# Patient Record
Sex: Female | Born: 1960 | Race: Asian | Hispanic: No | Marital: Married | Smoking: Never smoker
Health system: Southern US, Community
[De-identification: ages and names within clinical notes are randomized; demographics above are authoritative.]

## PROBLEM LIST (undated history)

## (undated) DIAGNOSIS — B009 Herpesviral infection, unspecified: Secondary | ICD-10-CM

## (undated) DIAGNOSIS — R202 Paresthesia of skin: Principal | ICD-10-CM

## (undated) DIAGNOSIS — E559 Vitamin D deficiency, unspecified: Secondary | ICD-10-CM

## (undated) DIAGNOSIS — D563 Thalassemia minor: Secondary | ICD-10-CM

## (undated) HISTORY — DX: Vitamin D deficiency, unspecified: E55.9

## (undated) HISTORY — PX: TONSILLECTOMY: SUR1361

## (undated) HISTORY — PX: HEMORRHOID SURGERY: SHX153

## (undated) HISTORY — DX: Paresthesia of skin: R20.2

## (undated) HISTORY — DX: Herpesviral infection, unspecified: B00.9

## (undated) HISTORY — DX: Thalassemia minor: D56.3

---

## 1997-09-24 ENCOUNTER — Other Ambulatory Visit: Admission: RE | Admit: 1997-09-24 | Discharge: 1997-09-24 | Payer: Self-pay | Admitting: Obstetrics and Gynecology

## 1998-10-07 ENCOUNTER — Other Ambulatory Visit: Admission: RE | Admit: 1998-10-07 | Discharge: 1998-10-07 | Payer: Self-pay | Admitting: Gynecology

## 1999-08-24 ENCOUNTER — Other Ambulatory Visit: Admission: RE | Admit: 1999-08-24 | Discharge: 1999-08-24 | Payer: Self-pay | Admitting: Gynecology

## 1999-08-26 ENCOUNTER — Ambulatory Visit (HOSPITAL_COMMUNITY): Admission: RE | Admit: 1999-08-26 | Discharge: 1999-08-26 | Payer: Self-pay | Admitting: Gynecology

## 1999-08-26 ENCOUNTER — Encounter: Payer: Self-pay | Admitting: Gynecology

## 2001-06-28 ENCOUNTER — Other Ambulatory Visit: Admission: RE | Admit: 2001-06-28 | Discharge: 2001-06-28 | Payer: Self-pay | Admitting: Gynecology

## 2001-07-17 ENCOUNTER — Encounter: Payer: Self-pay | Admitting: Gynecology

## 2001-07-17 ENCOUNTER — Ambulatory Visit (HOSPITAL_COMMUNITY): Admission: RE | Admit: 2001-07-17 | Discharge: 2001-07-17 | Payer: Self-pay | Admitting: Gynecology

## 2001-10-31 ENCOUNTER — Encounter: Admission: RE | Admit: 2001-10-31 | Discharge: 2001-12-05 | Payer: Self-pay | Admitting: Orthopedic Surgery

## 2002-07-31 ENCOUNTER — Ambulatory Visit (HOSPITAL_COMMUNITY): Admission: RE | Admit: 2002-07-31 | Discharge: 2002-07-31 | Payer: Self-pay | Admitting: Gynecology

## 2002-07-31 ENCOUNTER — Encounter: Payer: Self-pay | Admitting: Gynecology

## 2002-12-13 ENCOUNTER — Other Ambulatory Visit: Admission: RE | Admit: 2002-12-13 | Discharge: 2002-12-13 | Payer: Self-pay | Admitting: Gynecology

## 2003-12-08 ENCOUNTER — Ambulatory Visit (HOSPITAL_COMMUNITY): Admission: RE | Admit: 2003-12-08 | Discharge: 2003-12-08 | Payer: Self-pay | Admitting: Gynecology

## 2004-01-08 ENCOUNTER — Other Ambulatory Visit: Admission: RE | Admit: 2004-01-08 | Discharge: 2004-01-08 | Payer: Self-pay | Admitting: Gynecology

## 2006-02-06 ENCOUNTER — Other Ambulatory Visit: Admission: RE | Admit: 2006-02-06 | Discharge: 2006-02-06 | Payer: Self-pay | Admitting: Gynecology

## 2006-08-24 ENCOUNTER — Ambulatory Visit (HOSPITAL_COMMUNITY): Admission: RE | Admit: 2006-08-24 | Discharge: 2006-08-24 | Payer: Self-pay | Admitting: Gynecology

## 2007-06-22 ENCOUNTER — Other Ambulatory Visit: Admission: RE | Admit: 2007-06-22 | Discharge: 2007-06-22 | Payer: Self-pay | Admitting: Gynecology

## 2007-08-29 ENCOUNTER — Ambulatory Visit (HOSPITAL_COMMUNITY): Admission: RE | Admit: 2007-08-29 | Discharge: 2007-08-29 | Payer: Self-pay | Admitting: Gynecology

## 2007-09-04 ENCOUNTER — Encounter: Admission: RE | Admit: 2007-09-04 | Discharge: 2007-09-04 | Payer: Self-pay | Admitting: Gynecology

## 2008-03-10 ENCOUNTER — Ambulatory Visit: Payer: Self-pay | Admitting: Gynecology

## 2008-03-17 ENCOUNTER — Ambulatory Visit: Payer: Self-pay | Admitting: Gynecology

## 2008-05-09 HISTORY — PX: OTHER SURGICAL HISTORY: SHX169

## 2008-05-12 ENCOUNTER — Ambulatory Visit: Payer: Self-pay | Admitting: Gynecology

## 2008-05-14 ENCOUNTER — Ambulatory Visit: Payer: Self-pay | Admitting: Gynecology

## 2008-05-15 ENCOUNTER — Ambulatory Visit (HOSPITAL_BASED_OUTPATIENT_CLINIC_OR_DEPARTMENT_OTHER): Admission: RE | Admit: 2008-05-15 | Discharge: 2008-05-15 | Payer: Self-pay | Admitting: Gynecology

## 2008-05-15 ENCOUNTER — Encounter: Payer: Self-pay | Admitting: Gynecology

## 2008-05-15 ENCOUNTER — Ambulatory Visit: Payer: Self-pay | Admitting: Gynecology

## 2008-05-28 ENCOUNTER — Ambulatory Visit: Payer: Self-pay | Admitting: Gynecology

## 2008-06-23 ENCOUNTER — Other Ambulatory Visit: Admission: RE | Admit: 2008-06-23 | Discharge: 2008-06-23 | Payer: Self-pay | Admitting: Gynecology

## 2008-06-23 ENCOUNTER — Encounter: Payer: Self-pay | Admitting: Gynecology

## 2008-06-23 ENCOUNTER — Ambulatory Visit: Payer: Self-pay | Admitting: Gynecology

## 2008-09-17 ENCOUNTER — Ambulatory Visit (HOSPITAL_COMMUNITY): Admission: RE | Admit: 2008-09-17 | Discharge: 2008-09-17 | Payer: Self-pay | Admitting: Gynecology

## 2009-09-18 ENCOUNTER — Other Ambulatory Visit: Admission: RE | Admit: 2009-09-18 | Discharge: 2009-09-18 | Payer: Self-pay | Admitting: Gynecology

## 2009-09-18 ENCOUNTER — Ambulatory Visit (HOSPITAL_COMMUNITY): Admission: RE | Admit: 2009-09-18 | Discharge: 2009-09-18 | Payer: Self-pay | Admitting: Gynecology

## 2009-09-18 ENCOUNTER — Ambulatory Visit: Payer: Self-pay | Admitting: Gynecology

## 2010-05-30 ENCOUNTER — Encounter: Payer: Self-pay | Admitting: Gynecology

## 2010-09-21 NOTE — H&P (Signed)
[name], [name]                    ACCOUNT NO.:  [id number]   MEDICAL RECORD NO.:  [id number]          PATIENT TYPE:  AMB   LOCATION:  [facility]                         FACILITY:  [facility]   PHYSICIAN:  Juan H. Lily Peer, M.D.DATE OF BIRTH:  1960/12/02   DATE OF ADMISSION:  DATE OF DISCHARGE:                              HISTORY & PHYSICAL   The patient is scheduled for surgery on Thursday, January 7th at 7:30  a.m. at [facility].  Please have physical available.   CHIEF COMPLAINT:  1. Dysfunctional uterine bleeding.  2. Endometrial polyp.   HISTORY OF PRESENT ILLNESS:  The patient is a 50 year old gravida 2,  para 2 who was seen in the office for sonohysterogram, discussion of  previous endometrial biopsy, and blood work.  Since, the patient had had  a history of dysfunctional uterine bleeding and bleeding also  intermenstrual.  She had been placed on Megace 20 mg for 5 days to stop  her bleeding.  Her TSH, prolactin, and FSH were all normal.  She had  been using condoms for contraception.  UPT had been negative.  Endometrial biopsy demonstrated a benign proliferative endometrium.  No  evidence of hyperplasia or malignancy.  The sonohysterogram demonstrated  a small intramural myoma measuring 18 mm x 17 mm, echogenic focus in the  endometrial cavity was noted measuring 7 mm x 6 mm, right dominant  ovarian follicle measuring 21 mm x 19 mm, and the left ovary had a  follicle measuring 26 mm x 23 mm.  The sonohysterogram demonstrated an  echogenic defect measuring 8 mm x 5 mm and synechia 6 mm on the anterior  uterine wall.  The patient is scheduled to undergo diagnostic  hysteroscopy and resectoscope polypectomy.   PAST MEDICAL HISTORY:  She has had one previous tubal sterilization  procedure and one normal spontaneous vaginal delivery.  She has had  history in the past of HSV2.  The patient also has beta-thalassemia  minor trait.   ALLERGIES:  The patient denies any  allergies.   FAMILY HISTORY:  Significant for father for diabetes.   PHYSICAL EXAMINATION:  VITAL SIGNS:  The patient weighs 135 pounds, she  is 5 feet 1/4 inches tall, and blood pressure is 120/80.  HEENT:  Unremarkable.  NECK:  Supple.  Trachea midline.  No carotid bruits.  No thyromegaly.  LUNGS:  Clear to auscultation without rhonchi or wheezes.  HEART:  Regular rate and rhythm.  No murmurs or gallop.  BREAST:  Not done.  ABDOMEN:  Soft and nontender.  No rebound.  No guarding.  PELVIC:  Bartholin, urethra, and Skene's are within normal limits.  VAGINA AND CERVIX:  No gross lesions on inspection.  UTERUS:  Slightly retroverted.  ADNEXA:  No palpable mass or tenderness.  RECTAL:  Deferred.   ASSESSMENT:  A 50 year old gravida 2, para 2 female with dysfunctional  uterine bleeding.  Sonohysterogram demonstrated endometrial polyp.  Also, the patient with history of beta-thalassemia minor trait.  Endometrial biopsy was benign.  The patient is scheduled to undergo  resectoscope polypectomy and  diagnostic hysteroscopy.  The risks,  benefits, and pros and cons of procedure were discussed.  All questions  were answered and we will follow accordingly.   PLAN:  The patient is scheduled for diagnostic hysteroscopy and  resectoscope polypectomy on Thursday, January 7th at [facility].  Please have history and physical available.      Juan H. Lily Peer, M.D.  Electronically Signed     JHF/MEDQ  D:  05/14/2008  T:  05/14/2008  Job:  [id number]

## 2010-09-21 NOTE — Op Note (Signed)
[name], [name]                    ACCOUNT NO.:  [id number]   MEDICAL RECORD NO.:  [id number]          PATIENT TYPE:  AMB   LOCATION:  [facility]                         FACILITY:  [facility]   PHYSICIAN:  Juan H. Lily Peer, M.D.DATE OF BIRTH:  1961/03/08   DATE OF PROCEDURE:  DATE OF DISCHARGE:                               OPERATIVE REPORT   SURGEON:  Juan H. Lily Peer, MD   INDICATIONS FOR OPERATION:  A 50 year old gravida 2, para 2 who is being  taken to the operating room as a result of her dysfunctional uterine  bleeding.  Preoperative evaluation had demonstrated endometrial polyps.  Endometrial biopsy had been benign.   PREOPERATIVE DIAGNOSES:  1. Dysfunctional uterine bleeding.  2. Endometrial polyps.   POSTOPERATIVE DIAGNOSIS:  1. Dysfunctional uterine bleeding.  2. Endometrial polyps.   ANESTHESIA:  General endotracheal anesthesia.   PROCEDURE PERFORMED:  1. Diagnostic hysteroscopy.  2. Resectoscopic polypectomy.   FINDINGS:  The patient had two small lower uterine segment endometrial  polyps; tubal ostia, minimally seen; and the cervical canal was clear.   DESCRIPTION OF OPERATION:  After the patient was adequately counseled,  she was taken to the operating room where she underwent a successful  general endotracheal anesthesia.  She had received a gram of cefotetan  for prophylaxis, had PSA stockings for DVT prophylaxis.  The laminaria  that had been placed intracervical the day before was removed.  The  vagina and perineum were prepped and draped in usual sterile fashion.  A  red rubber Roxan Hockey had been inserted to evacuate the bladder of its  contents for approximately 50 mL.  A short-billed speculum was placed in  the posterior vaginal vault.  A Sims retractor in the anterior vaginal  vault.  The anterior cervical lip was grasped with a single-tooth  tenaculum.  The cervix required no dilatation as a result of the  Cervidil.  The uterus sounded to approximately 8 cm  and the uterus was  retroverted.  The Lendell Caprice operative resectoscope with a 90 degrees  wire loop was introduced into the intrauterine cavity with 3% sorbitol  with the distending media.  The [facility] surgical generator  was set at 80 coagulation mode and 80 on the cutting mode.  A systematic  inspection demonstrated both endometrial polyps in the lower uterine  segment anteriorly, were resected off its base, and submitted for  histological evaluation.  There was a lot of bleeding and oozing.  A  vigorous curettage was performed afterwards and a separate specimen was  submitted to contain the bleeding.  The VaporTrode was utilized for  hemostasis.  The patient tolerated the procedure well.  The patient's  preprocedure and postprocedure were taken.   ESTIMATED BLOOD LOSS:  Minimal.   FLUID RESUSCITATION:  600 mL of lactated Ringer's.   FLUID DEFICIT:  3% sorbitol was 100 mL.      Juan H. Lily Peer, M.D.  Electronically Signed     JHF/MEDQ  D:  05/15/2008  T:  05/15/2008  Job:  [id number]

## 2010-10-08 ENCOUNTER — Other Ambulatory Visit: Payer: Self-pay | Admitting: Gynecology

## 2010-10-08 DIAGNOSIS — Z1231 Encounter for screening mammogram for malignant neoplasm of breast: Secondary | ICD-10-CM

## 2010-10-14 ENCOUNTER — Ambulatory Visit (HOSPITAL_COMMUNITY)
Admission: RE | Admit: 2010-10-14 | Discharge: 2010-10-14 | Disposition: A | Discharge status: Home / Self Care | Payer: BC Managed Care – PPO | Source: Ambulatory Visit | Attending: Gynecology | Admitting: Gynecology

## 2010-10-14 DIAGNOSIS — Z1231 Encounter for screening mammogram for malignant neoplasm of breast: Secondary | ICD-10-CM | POA: Insufficient documentation

## 2010-10-27 ENCOUNTER — Encounter (INDEPENDENT_AMBULATORY_CARE_PROVIDER_SITE_OTHER): Payer: BC Managed Care – PPO | Admitting: Gynecology

## 2010-10-27 ENCOUNTER — Other Ambulatory Visit: Payer: Self-pay | Admitting: Gynecology

## 2010-10-27 ENCOUNTER — Other Ambulatory Visit (HOSPITAL_COMMUNITY)
Admission: RE | Admit: 2010-10-27 | Discharge: 2010-10-27 | Disposition: A | Discharge status: Home / Self Care | Payer: BC Managed Care – PPO | Source: Ambulatory Visit | Attending: Gynecology | Admitting: Gynecology

## 2010-10-27 DIAGNOSIS — N831 Corpus luteum cyst of ovary, unspecified side: Secondary | ICD-10-CM

## 2010-10-27 DIAGNOSIS — D259 Leiomyoma of uterus, unspecified: Secondary | ICD-10-CM

## 2010-10-27 DIAGNOSIS — N942 Vaginismus: Secondary | ICD-10-CM

## 2010-10-27 DIAGNOSIS — Z01419 Encounter for gynecological examination (general) (routine) without abnormal findings: Secondary | ICD-10-CM

## 2010-10-27 DIAGNOSIS — Z124 Encounter for screening for malignant neoplasm of cervix: Secondary | ICD-10-CM | POA: Insufficient documentation

## 2011-04-08 ENCOUNTER — Ambulatory Visit
Admission: RE | Admit: 2011-04-08 | Discharge: 2011-04-08 | Disposition: A | Discharge status: Home / Self Care | Payer: BC Managed Care – PPO | Source: Ambulatory Visit | Attending: Internal Medicine | Admitting: Internal Medicine

## 2011-04-08 ENCOUNTER — Other Ambulatory Visit: Payer: Self-pay | Admitting: Internal Medicine

## 2011-04-08 DIAGNOSIS — R52 Pain, unspecified: Secondary | ICD-10-CM

## 2011-05-10 HISTORY — PX: COLONOSCOPY W/ POLYPECTOMY: SHX1380

## 2011-10-19 ENCOUNTER — Other Ambulatory Visit: Payer: Self-pay | Admitting: Gynecology

## 2011-10-19 DIAGNOSIS — Z1231 Encounter for screening mammogram for malignant neoplasm of breast: Secondary | ICD-10-CM

## 2011-11-16 ENCOUNTER — Ambulatory Visit (INDEPENDENT_AMBULATORY_CARE_PROVIDER_SITE_OTHER): Payer: BC Managed Care – PPO | Admitting: Gynecology

## 2011-11-16 ENCOUNTER — Encounter: Payer: Self-pay | Admitting: Gynecology

## 2011-11-16 ENCOUNTER — Ambulatory Visit (HOSPITAL_COMMUNITY)
Admission: RE | Admit: 2011-11-16 | Discharge: 2011-11-16 | Disposition: A | Discharge status: Home / Self Care | Payer: BC Managed Care – PPO | Source: Ambulatory Visit | Attending: Gynecology | Admitting: Gynecology

## 2011-11-16 VITALS — BP 120/80 | Ht 62.25 in | Wt 127.0 lb

## 2011-11-16 DIAGNOSIS — Z1231 Encounter for screening mammogram for malignant neoplasm of breast: Secondary | ICD-10-CM

## 2011-11-16 DIAGNOSIS — Z780 Asymptomatic menopausal state: Secondary | ICD-10-CM | POA: Insufficient documentation

## 2011-11-16 DIAGNOSIS — Z01419 Encounter for gynecological examination (general) (routine) without abnormal findings: Secondary | ICD-10-CM

## 2011-11-16 NOTE — Patient Instructions (Addendum)
You need to take a calcium with vitamin D twice a day. Several options are: Caltrate Plus or Oscal or Citracal or Viactiv.  Daily requirement should be: Calciium 1200-1500 mg/day and vitamin D 2,000 units per day.  Health Maintenance, Females A healthy lifestyle and preventative care can promote health and wellness.  Maintain regular health, dental, and eye exams.   Eat a healthy diet. Foods like vegetables, fruits, whole grains, low-fat dairy products, and lean protein foods contain the nutrients you need without too many calories. Decrease your intake of foods high in solid fats, added sugars, and salt. Get information about a proper diet from your caregiver, if necessary.   Regular physical exercise is one of the most important things you can do for your health. Most adults should get at least 150 minutes of moderate-intensity exercise (any activity that increases your heart rate and causes you to sweat) each week. In addition, most adults need muscle-strengthening exercises on 2 or more days a week.    Maintain a healthy weight. The body mass index (BMI) is a screening tool to identify possible weight problems. It provides an estimate of body fat based on height and weight. Your caregiver can help determine your BMI, and can help you achieve or maintain a healthy weight. For adults 20 years and older:   A BMI below 18.5 is considered underweight.   A BMI of 18.5 to 24.9 is normal.   A BMI of 25 to 29.9 is considered overweight.   A BMI of 30 and above is considered obese.   Maintain normal blood lipids and cholesterol by exercising and minimizing your intake of saturated fat. Eat a balanced diet with plenty of fruits and vegetables. Blood tests for lipids and cholesterol should begin at age 65 and be repeated every 5 years. If your lipid or cholesterol levels are high, you are over 50, or you are a high risk for heart disease, you may need your cholesterol levels checked more  frequently.Ongoing high lipid and cholesterol levels should be treated with medicines if diet and exercise are not effective.   If you smoke, find out from your caregiver how to quit. If you do not use tobacco, do not start.   If you are pregnant, do not drink alcohol. If you are breastfeeding, be very cautious about drinking alcohol. If you are not pregnant and choose to drink alcohol, do not exceed 1 drink per day. One drink is considered to be 12 ounces (355 mL) of beer, 5 ounces (148 mL) of wine, or 1.5 ounces (44 mL) of liquor.   Avoid use of street drugs. Do not share needles with anyone. Ask for help if you need support or instructions about stopping the use of drugs.   High blood pressure causes heart disease and increases the risk of stroke. Blood pressure should be checked at least every 1 to 2 years. Ongoing high blood pressure should be treated with medicines, if weight loss and exercise are not effective.   If you are 14 to 51 years old, ask your caregiver if you should take aspirin to prevent strokes.   Diabetes screening involves taking a blood sample to check your fasting blood sugar level. This should be done once every 3 years, after age 36, if you are within normal weight and without risk factors for diabetes. Testing should be considered at a younger age or be carried out more frequently if you are overweight and have at least 1 risk factor for  diabetes.   Breast cancer screening is essential preventative care for women. You should practice "breast self-awareness." This means understanding the normal appearance and feel of your breasts and may include breast self-examination. Any changes detected, no matter how small, should be reported to a caregiver. Women in their 24s and 30s should have a clinical breast exam (CBE) by a caregiver as part of a regular health exam every 1 to 3 years. After age 83, women should have a CBE every year. Starting at age 57, women should consider  having a mammogram (breast X-ray) every year. Women who have a family history of breast cancer should talk to their caregiver about genetic screening. Women at a high risk of breast cancer should talk to their caregiver about having an MRI and a mammogram every year.   The Pap test is a screening test for cervical cancer. Women should have a Pap test starting at age 37. Between ages 12 and 27, Pap tests should be repeated every 2 years. Beginning at age 45, you should have a Pap test every 3 years as long as the past 3 Pap tests have been normal. If you had a hysterectomy for a problem that was not cancer or a condition that could lead to cancer, then you no longer need Pap tests. If you are between ages 49 and 18, and you have had normal Pap tests going back 10 years, you no longer need Pap tests. If you have had past treatment for cervical cancer or a condition that could lead to cancer, you need Pap tests and screening for cancer for at least 20 years after your treatment. If Pap tests have been discontinued, risk factors (such as a new sexual partner) need to be reassessed to determine if screening should be resumed. Some women have medical problems that increase the chance of getting cervical cancer. In these cases, your caregiver may recommend more frequent screening and Pap tests.   The human papillomavirus (HPV) test is an additional test that may be used for cervical cancer screening. The HPV test looks for the virus that can cause the cell changes on the cervix. The cells collected during the Pap test can be tested for HPV. The HPV test could be used to screen women aged 65 years and older, and should be used in women of any age who have unclear Pap test results. After the age of 77, women should have HPV testing at the same frequency as a Pap test.   Colorectal cancer can be detected and often prevented. Most routine colorectal cancer screening begins at the age of 35 and continues through age 38.  However, your caregiver may recommend screening at an earlier age if you have risk factors for colon cancer. On a yearly basis, your caregiver may provide home test kits to check for hidden blood in the stool. Use of a small camera at the end of a tube, to directly examine the colon (sigmoidoscopy or colonoscopy), can detect the earliest forms of colorectal cancer. Talk to your caregiver about this at age 22, when routine screening begins. Direct examination of the colon should be repeated every 5 to 10 years through age 86, unless early forms of pre-cancerous polyps or small growths are found.   Hepatitis C blood testing is recommended for all people born from [birth year] through 1965 and any individual with known risks for hepatitis C.   Practice safe sex. Use condoms and avoid high-risk sexual practices to reduce the spread of  sexually transmitted infections (STIs). Sexually active women aged 8 and younger should be checked for Chlamydia, which is a common sexually transmitted infection. Older women with new or multiple partners should also be tested for Chlamydia. Testing for other STIs is recommended if you are sexually active and at increased risk.   Osteoporosis is a disease in which the bones lose minerals and strength with aging. This can result in serious bone fractures. The risk of osteoporosis can be identified using a bone density scan. Women ages 39 and over and women at risk for fractures or osteoporosis should discuss screening with their caregivers. Ask your caregiver whether you should be taking a calcium supplement or vitamin D to reduce the rate of osteoporosis.   Menopause can be associated with physical symptoms and risks. Hormone replacement therapy is available to decrease symptoms and risks. You should talk to your caregiver about whether hormone replacement therapy is right for you.   Use sunscreen with a sun protection factor (SPF) of 30 or greater. Apply sunscreen liberally and  repeatedly throughout the day. You should seek shade when your shadow is shorter than you. Protect yourself by wearing long sleeves, pants, a wide-brimmed hat, and sunglasses year round, whenever you are outdoors.   Notify your caregiver of new moles or changes in moles, especially if there is a change in shape or color. Also notify your caregiver if a mole is larger than the size of a pencil eraser.   Stay current with your immunizations.  Document Released: 11/08/2010 Document Revised: 04/14/2011 Document Reviewed: 11/08/2010 [facility] Patient Information 2012 [place], [place].  Menopause Menopause is the normal time of life when menstrual periods stop completely. Menopause is complete when you have missed 12 consecutive menstrual periods. It usually occurs between the ages of 56 to 62, with an average age of 26. Very rarely does a woman develop menopause before 51 years old. At menopause, your ovaries stop producing the female hormones, estrogen and progesterone. This can cause undesirable symptoms and also affect your health. Sometimes the symptoms may occur 4 to 5 years before the menopause begins. There is no relationship between menopause and:  Oral contraceptives.   Number of children you had.   Race.   The age your menstrual periods started (menarche).  Heavy smokers and very thin women may develop menopause earlier in life. CAUSES  The ovaries stop producing the female hormones estrogen and progesterone.   Other causes include:   Surgery to remove both ovaries.   The ovaries stop functioning for no known reason.   Tumors of the pituitary gland in the brain.   Medical disease that affects the ovaries and hormone production.   Radiation treatment to the abdomen or pelvis.   Chemotherapy that affects the ovaries.  SYMPTOMS   Hot flashes.   Night sweats.   Decrease in sex drive.   Vaginal dryness and thinning of the vagina causing painful intercourse.   Dryness of  the skin and developing wrinkles.   Headaches.   Tiredness.   Irritability.   Memory problems.   Weight gain.   Bladder infections.   Hair growth of the face and chest.   Infertility.  More serious symptoms include:  Loss of bone (osteoporosis) causing breaks (fractures).   Depression.   Hardening and narrowing of the arteries (atherosclerosis) causing heart attacks and strokes.  DIAGNOSIS   When the menstrual periods have stopped for 12 straight months.   Physical exam.   Hormone studies of the blood.  TREATMENT  There are many treatment choices and nearly as many questions about them. The decisions to treat or not to treat menopausal changes is an individual choice made with your caregiver. Your caregiver can discuss the treatments with you. Together, you can decide which treatment will work best for you. Your treatment choices may include:   Hormone therapy (estorgen and progesterone).   Non-hormonal medications.   Treating the individual symptoms with medication (for example antidepressants for depression).   Herbal medications that may help specific symptoms.   Counseling by a psychiatrist or psychologist.   Group therapy.   Lifestyle changes including:   Eating healthy.   Regular exercise.   Limiting caffeine and alcohol.   Stress management and meditation.   No treatment.  HOME CARE INSTRUCTIONS   Take the medication your caregiver gives you as directed.   Get plenty of sleep and rest.   Exercise regularly.   Eat a diet that contains calcium (good for the bones) and soy products (acts like estrogen hormone).   Avoid alcoholic beverages.   Do not smoke.   If you have hot flashes, dress in layers.   Take supplements, calcium and vitamin D to strengthen bones.   You can use over-the-counter lubricants or moisturizers for vaginal dryness.   Group therapy is sometimes very helpful.   Acupuncture may be helpful in some cases.  SEEK  MEDICAL CARE IF:   You are not sure you are in menopause.   You are having menopausal symptoms and need advice and treatment.   You are still having menstrual periods after age 9.   You have pain with intercourse.   Menopause is complete (no menstrual period for 12 months) and you develop vaginal bleeding.   You need a referral to a specialist (gynecologist, psychiatrist or psychologist) for treatment.  SEEK IMMEDIATE MEDICAL CARE IF:   You have severe depression.   You have excessive vaginal bleeding.   You fell and think you have a broken bone.   You have pain when you urinate.   You develop leg or chest pain.   You have a fast pounding heart beat (palpitations).   You have severe headaches.   You develop vision problems.   You feel a lump in your breast.   You have abdominal pain or severe indigestion.  Document Released: 07/16/2003 Document Revised: 04/14/2011 Document Reviewed: 02/21/2008 [facility] Patient Information 2012 [place], [place].

## 2011-11-16 NOTE — Progress Notes (Signed)
[name] [birth year] [id number]   History:    51 y.o.  for annual gyn exam with no complaints today. Review of patient's history indicates that she has a history of beta thalassemia. Her primary physician has recently done her labs. Her mammogram was done today. She in frequently does her self breast examination. Patient with minimal vasomotor symptoms. His been close to one year since her last menstrual period. Her FSH last year was 16. Her early part of this year patient had a colonoscopy and she stated that they removed 6 polyps. No prior bone density study.  Past medical history,surgical history, family history and social history were all reviewed and documented in the EPIC chart.  Gynecologic History No LMP recorded. Patient is not currently having periods (Reason: Perimenopausal). Contraception: none Last Pap: 2012. Results were: normal Last mammogram: 2013. Results were: normal  Obstetric History OB History    Grav Para Term Preterm Abortions TAB SAB Ect Mult Living   2 2 2       2      # Outc Date GA Lbr Len/2nd Wgt Sex Del Anes PTL Lv   1 TRM     M SVD  No Yes   2 TRM     M CS  No Yes       ROS: A ROS was performed and pertinent positives and negatives are included in the history.  GENERAL: No fevers or chills. HEENT: No change in vision, no earache, sore throat or sinus congestion. NECK: No pain or stiffness. CARDIOVASCULAR: No chest pain or pressure. No palpitations. PULMONARY: No shortness of breath, cough or wheeze. GASTROINTESTINAL: No abdominal pain, nausea, vomiting or diarrhea, melena or bright red blood per rectum. GENITOURINARY: No urinary frequency, urgency, hesitancy or dysuria. MUSCULOSKELETAL: No joint or muscle pain, no back pain, no recent trauma. DERMATOLOGIC: No rash, no itching, no lesions. ENDOCRINE: No polyuria, polydipsia, no heat or cold intolerance. No recent change in weight. HEMATOLOGICAL: No anemia or easy bruising or bleeding. NEUROLOGIC: No headache,  seizures, numbness, tingling or weakness. PSYCHIATRIC: No depression, no loss of interest in normal activity or change in sleep pattern.     Exam: chaperone present  BP 120/80  Ht 5' 2.25" (1.581 m)  Wt 127 lb (57.607 kg)  BMI 23.04 kg/m2  Body mass index is 23.04 kg/(m^2).  General appearance : Well developed well nourished female. No acute distress HEENT: Neck supple, trachea midline, no carotid bruits, no thyroidmegaly Lungs: Clear to auscultation, no rhonchi or wheezes, or rib retractions  Heart: Regular rate and rhythm, no murmurs or gallops Breast:Examined in sitting and supine position were symmetrical in appearance, no palpable masses or tenderness,  no skin retraction, no nipple inversion, no nipple discharge, no skin discoloration, no axillary or supraclavicular lymphadenopathy Abdomen: no palpable masses or tenderness, no rebound or guarding Extremities: no edema or skin discoloration or tenderness  Pelvic:  Bartholin, Urethra, Skene Glands: Within normal limits             Vagina: No gross lesions or discharge  Cervix: No gross lesions or discharge  Uterus  anteverted, normal size, shape and consistency, non-tender and mobile  Adnexa  Without masses or tenderness  Anus and perineum  normal   Rectovaginal  normal sphincter tone without palpated masses or tenderness             Hemoccult not done     Assessment/Plan:  51 y.o. female for annual exam with perimenopausal symptoms. And normal FSH  last year. Patient with minimal symptoms today. An FSH will be drawn today. She will be scheduled for a baseline bone density study in the next few weeks here in the office. Literature information on the menopause and hormone replacement therapy was provided. We discussed importance of calcium and vitamin D along with regular weightbearing exercises for osteoporosis prevention.    Ok Edwards MD, 5:13 PM 11/16/2011

## 2011-11-17 LAB — FOLLICLE STIMULATING HORMONE: FSH: 56.5 m[IU]/mL

## 2011-12-27 ENCOUNTER — Ambulatory Visit (INDEPENDENT_AMBULATORY_CARE_PROVIDER_SITE_OTHER): Payer: BC Managed Care – PPO

## 2011-12-27 DIAGNOSIS — M8580 Other specified disorders of bone density and structure, unspecified site: Secondary | ICD-10-CM

## 2011-12-27 DIAGNOSIS — M949 Disorder of cartilage, unspecified: Secondary | ICD-10-CM

## 2011-12-27 DIAGNOSIS — Z780 Asymptomatic menopausal state: Secondary | ICD-10-CM

## 2011-12-29 ENCOUNTER — Encounter: Payer: Self-pay | Admitting: Gynecology

## 2012-10-22 ENCOUNTER — Ambulatory Visit: Payer: BC Managed Care – PPO | Admitting: Family Medicine

## 2012-10-30 ENCOUNTER — Other Ambulatory Visit: Payer: Self-pay | Admitting: Gynecology

## 2012-10-30 DIAGNOSIS — Z1231 Encounter for screening mammogram for malignant neoplasm of breast: Secondary | ICD-10-CM

## 2012-11-27 ENCOUNTER — Encounter: Payer: Self-pay | Admitting: Gynecology

## 2012-11-27 ENCOUNTER — Ambulatory Visit (HOSPITAL_COMMUNITY)
Admission: RE | Admit: 2012-11-27 | Discharge: 2012-11-27 | Disposition: A | Discharge status: Home / Self Care | Payer: BC Managed Care – PPO | Source: Ambulatory Visit | Attending: Gynecology | Admitting: Gynecology

## 2012-11-27 ENCOUNTER — Ambulatory Visit (INDEPENDENT_AMBULATORY_CARE_PROVIDER_SITE_OTHER): Payer: BC Managed Care – PPO | Admitting: Gynecology

## 2012-11-27 VITALS — BP 128/76 | Ht 61.75 in | Wt 134.0 lb

## 2012-11-27 DIAGNOSIS — D563 Thalassemia minor: Secondary | ICD-10-CM

## 2012-11-27 DIAGNOSIS — N951 Menopausal and female climacteric states: Secondary | ICD-10-CM

## 2012-11-27 DIAGNOSIS — Z01419 Encounter for gynecological examination (general) (routine) without abnormal findings: Secondary | ICD-10-CM

## 2012-11-27 DIAGNOSIS — Z780 Asymptomatic menopausal state: Secondary | ICD-10-CM

## 2012-11-27 DIAGNOSIS — Z1231 Encounter for screening mammogram for malignant neoplasm of breast: Secondary | ICD-10-CM

## 2012-11-27 NOTE — Progress Notes (Signed)
[name] 1960/12/09 [id number]   History:    52 y.o.  for annual gyn exam today with no complaints.Review of patient's history indicates that she has a history of beta thalassemia. Her primary physician has recently done her labs. Her mammogram was done today. She in frequently does her self breast examination. Patient with minimal vasomotor symptoms. Her last FSH in 2013 was elevated at 56.5. Review of her record indicating she was weighing 127 a subcutaneous 134. She is not taking calcium and vitamin D. Her last bone density study in 2013 demonstrated her lowest T score was at the AP spine with a value -1.4. Patient had colonoscopy in 2013 and benign polyps were detected according to the patient. Her primary physician is provided her with Hemoccult cards for testing. Patient denies any prior history of abnormal Pap smears.  Past medical history,surgical history, family history and social history were all reviewed and documented in the EPIC chart.  Gynecologic History No LMP recorded. Patient is not currently having periods (Reason: Perimenopausal). Contraception: post menopausal status Last Pap: 2012. Results were: normal Last mammogram: done today. Results were: results pending  Obstetric History OB History   Grav Para Term Preterm Abortions TAB SAB Ect Mult Living   2 2 2       2      # Outc Date GA Lbr Len/2nd Wgt Sex Del Anes PTL Lv   1 TRM     M SVD  No Yes   2 TRM     M CS  No Yes       ROS: A ROS was performed and pertinent positives and negatives are included in the history.  GENERAL: No fevers or chills. HEENT: No change in vision, no earache, sore throat or sinus congestion. NECK: No pain or stiffness. CARDIOVASCULAR: No chest pain or pressure. No palpitations. PULMONARY: No shortness of breath, cough or wheeze. GASTROINTESTINAL: No abdominal pain, nausea, vomiting or diarrhea, melena or bright red blood per rectum. GENITOURINARY: No urinary frequency, urgency, hesitancy or  dysuria. MUSCULOSKELETAL: No joint or muscle pain, no back pain, no recent trauma. DERMATOLOGIC: No rash, no itching, no lesions. ENDOCRINE: No polyuria, polydipsia, no heat or cold intolerance. No recent change in weight. HEMATOLOGICAL: No anemia or easy bruising or bleeding. NEUROLOGIC: No headache, seizures, numbness, tingling or weakness. PSYCHIATRIC: No depression, no loss of interest in normal activity or change in sleep pattern.     Exam: chaperone present  BP 128/76  Ht 5' 1.75" (1.568 m)  Wt 134 lb (60.782 kg)  BMI 24.72 kg/m2  Body mass index is 24.72 kg/(m^2).  General appearance : Well developed well nourished female. No acute distress HEENT: Neck supple, trachea midline, no carotid bruits, no thyroidmegaly Lungs: Clear to auscultation, no rhonchi or wheezes, or rib retractions  Heart: Regular rate and rhythm, no murmurs or gallops Breast:Examined in sitting and supine position were symmetrical in appearance, no palpable masses or tenderness,  no skin retraction, no nipple inversion, no nipple discharge, no skin discoloration, no axillary or supraclavicular lymphadenopathy Abdomen: no palpable masses or tenderness, no rebound or guarding Extremities: no edema or skin discoloration or tenderness  Pelvic:  Bartholin, Urethra, Skene Glands: Within normal limits             Vagina: No gross lesions or discharge  Cervix: No gross lesions or discharge  Uterus  anteverted, normal size, shape and consistency, non-tender and mobile  Adnexa  Without masses or tenderness  Anus and perineum  normal   Rectovaginal  normal sphincter tone without palpated masses or tenderness             Hemoccult PCP provides     Assessment/Plan:  52 y.o. female for annual exam menopausal with no complaints. Patient was reminded on the importance of calcium and vitamin D along with regular exercise for osteoporosis prevention. She will not need a bone density study until next year. Pap smear not done  today in accordance with the guidelines. Mammogram report pending at time of this dictation. Patient was reminded to continue to do her monthly breast exam. Patient will check with her primary physician which she is seen next week to discuss to see if she has had the Tdap vaccine in the past.    Ok Edwards MD, 3:25 PM 11/27/2012

## 2012-11-27 NOTE — Patient Instructions (Signed)

## 2014-01-03 ENCOUNTER — Other Ambulatory Visit: Payer: Self-pay | Admitting: Gynecology

## 2014-01-03 DIAGNOSIS — Z1231 Encounter for screening mammogram for malignant neoplasm of breast: Secondary | ICD-10-CM

## 2014-02-04 ENCOUNTER — Ambulatory Visit (HOSPITAL_COMMUNITY)
Admission: RE | Admit: 2014-02-04 | Discharge: 2014-02-04 | Disposition: A | Discharge status: Home / Self Care | Payer: BC Managed Care – PPO | Source: Ambulatory Visit | Attending: Gynecology | Admitting: Gynecology

## 2014-02-04 ENCOUNTER — Other Ambulatory Visit (HOSPITAL_COMMUNITY)
Admission: RE | Admit: 2014-02-04 | Discharge: 2014-02-04 | Disposition: A | Discharge status: Home / Self Care | Payer: BC Managed Care – PPO | Source: Ambulatory Visit | Attending: Gynecology | Admitting: Gynecology

## 2014-02-04 ENCOUNTER — Encounter: Payer: Self-pay | Admitting: Gynecology

## 2014-02-04 ENCOUNTER — Ambulatory Visit (INDEPENDENT_AMBULATORY_CARE_PROVIDER_SITE_OTHER): Payer: BC Managed Care – PPO | Admitting: Gynecology

## 2014-02-04 VITALS — BP 120/76 | Ht 63.0 in | Wt 127.0 lb

## 2014-02-04 DIAGNOSIS — Z1151 Encounter for screening for human papillomavirus (HPV): Secondary | ICD-10-CM | POA: Diagnosis present

## 2014-02-04 DIAGNOSIS — M949 Disorder of cartilage, unspecified: Secondary | ICD-10-CM

## 2014-02-04 DIAGNOSIS — Z01419 Encounter for gynecological examination (general) (routine) without abnormal findings: Secondary | ICD-10-CM | POA: Insufficient documentation

## 2014-02-04 DIAGNOSIS — N951 Menopausal and female climacteric states: Secondary | ICD-10-CM

## 2014-02-04 DIAGNOSIS — M899 Disorder of bone, unspecified: Secondary | ICD-10-CM

## 2014-02-04 DIAGNOSIS — Z1231 Encounter for screening mammogram for malignant neoplasm of breast: Secondary | ICD-10-CM

## 2014-02-04 DIAGNOSIS — Z780 Asymptomatic menopausal state: Secondary | ICD-10-CM

## 2014-02-04 DIAGNOSIS — M8580 Other specified disorders of bone density and structure, unspecified site: Secondary | ICD-10-CM | POA: Insufficient documentation

## 2014-02-04 NOTE — Patient Instructions (Signed)

## 2014-02-04 NOTE — Progress Notes (Signed)
[name] [birth year] [id number]  53 year old presented to the office today for her annual gynecological examination and was having no complaints.Review of patient's history indicates that she has a history of beta thalassemia. Her primary physician has recently done her labs. Her mammogram was done today. She in frequently does her self breast examination. Patient with minimal vasomotor symptoms. Her last McClure in 2013 was elevated at 56.5.Her last bone density study in 2013 demonstrated her lowest T score was at the AP spine with a value -1.4.   Colonoscopy 2013 benign polyps Colonoscopy 2015 benign polyps  Patient denies any prior history of abnormal Pap smears. Patient declined flu vaccine today.    Past medical history,surgical history, family history and social history were all reviewed and documented in the EPIC chart.  Gynecologic History No LMP recorded. Patient is postmenopausal. Contraception: post menopausal status Last Pap: 2012. Results were: normal Last mammogram: 2015. Results were: Done today results pending  Obstetric History OB History  Gravida Para Term Preterm AB SAB TAB Ectopic Multiple Living  2 2 2       2     # Outcome Date GA Lbr Len/2nd Weight Sex Delivery Anes PTL Lv  2 TRM     M CS  N Y  1 TRM     M SVD  N Y       ROS: A ROS was performed and pertinent positives and negatives are included in the history.  GENERAL: No fevers or chills. HEENT: No change in vision, no earache, sore throat or sinus congestion. NECK: No pain or stiffness. CARDIOVASCULAR: No chest pain or pressure. No palpitations. PULMONARY: No shortness of breath, cough or wheeze. GASTROINTESTINAL: No abdominal pain, nausea, vomiting or diarrhea, melena or bright red blood per rectum. GENITOURINARY: No urinary frequency, urgency, hesitancy or dysuria. MUSCULOSKELETAL: No joint or muscle pain, no back pain, no recent trauma. DERMATOLOGIC: No rash, no itching, no lesions. ENDOCRINE: No  polyuria, polydipsia, no heat or cold intolerance. No recent change in weight. HEMATOLOGICAL: No anemia or easy bruising or bleeding. NEUROLOGIC: No headache, seizures, numbness, tingling or weakness. PSYCHIATRIC: No depression, no loss of interest in normal activity or change in sleep pattern.     Exam: chaperone present  BP 120/76  Ht 5\' 3"  (1.6 m)  Wt 127 lb (57.607 kg)  BMI 22.50 kg/m2  Body mass index is 22.5 kg/(m^2).  General appearance : Well developed well nourished female. No acute distress HEENT: Neck supple, trachea midline, no carotid bruits, no thyroidmegaly Lungs: Clear to auscultation, no rhonchi or wheezes, or rib retractions  Heart: Regular rate and rhythm, no murmurs or gallops Breast:Examined in sitting and supine position were symmetrical in appearance, no palpable masses or tenderness,  no skin retraction, no nipple inversion, no nipple discharge, no skin discoloration, no axillary or supraclavicular lymphadenopathy Abdomen: no palpable masses or tenderness, no rebound or guarding Extremities: no edema or skin discoloration or tenderness  Pelvic:  Bartholin, Urethra, Skene Glands: Within normal limits             Vagina: No gross lesions or discharge, atrophic changes  Cervix: No gross lesions or discharge  Uterus  anteverted, normal size, shape and consistency, non-tender and mobile  Adnexa  Without masses or tenderness  Anus and perineum  normal   Rectovaginal  normal sphincter tone without palpated masses or tenderness             Hemoccult cards provided     Assessment/Plan:  53 y.o. female for annual exam who is menopausal with minimal vasomotor symptoms on no hormone replacement therapy and not interested. Patient will need to schedule her bone density study in the next few weeks. We discussed importance of calcium and vitamin D and regular exercise for osteoporosis prevention. Pap smear was done today. PCP has done her blood work. Patient declined flu  vaccine.  Note: This dictation was prepared with  Dragon/digital dictation along withSmart phrase technology. Any transcriptional errors that result from this process are unintentional.   Terrance Mass MD, 1:19 PM 02/04/2014

## 2014-02-07 LAB — CYTOLOGY - PAP

## 2014-03-04 ENCOUNTER — Ambulatory Visit (INDEPENDENT_AMBULATORY_CARE_PROVIDER_SITE_OTHER): Payer: BC Managed Care – PPO

## 2014-03-04 DIAGNOSIS — M8580 Other specified disorders of bone density and structure, unspecified site: Secondary | ICD-10-CM

## 2014-03-10 ENCOUNTER — Encounter: Payer: Self-pay | Admitting: Gynecology

## 2014-07-02 IMAGING — MG MM DIGITAL SCREENING BILAT
2 series · 2 of 2 positions shown · non-contrast
Comparison: Previous exams

CLINICAL DATA: Screening.

DIGITAL SCREENING MAMMOGRAM WITH CAD

[R CC]
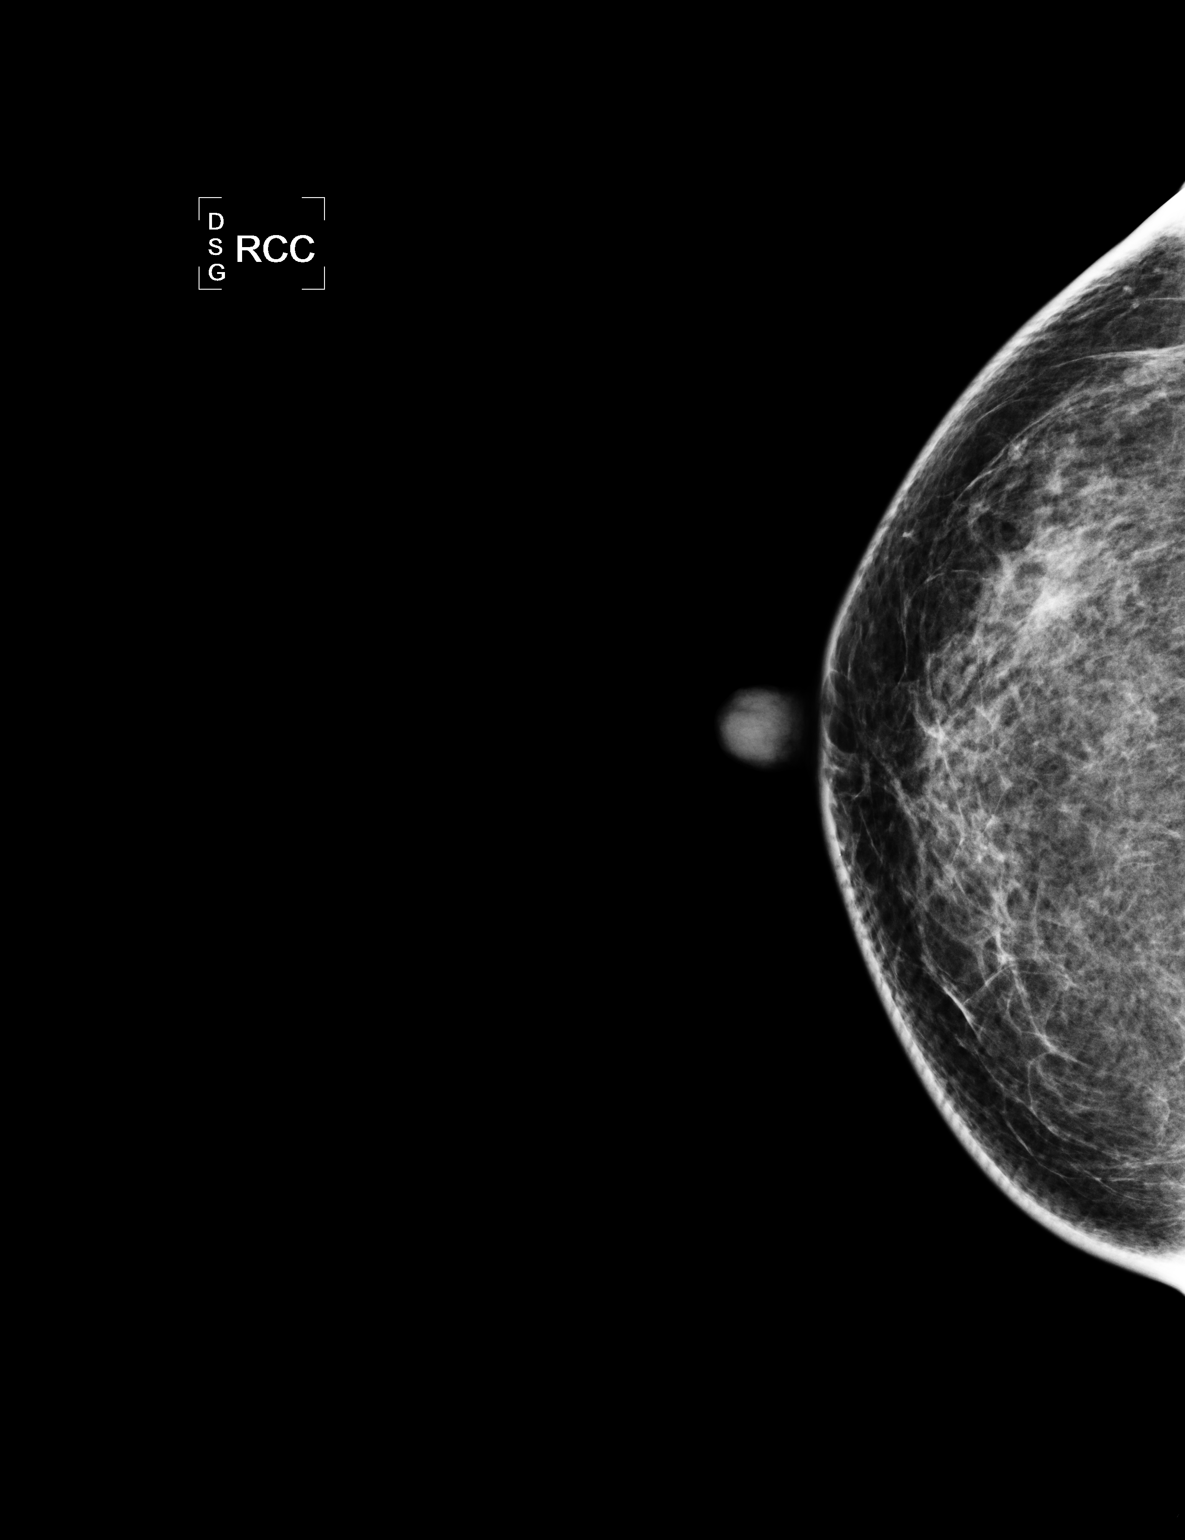

[L MLO]
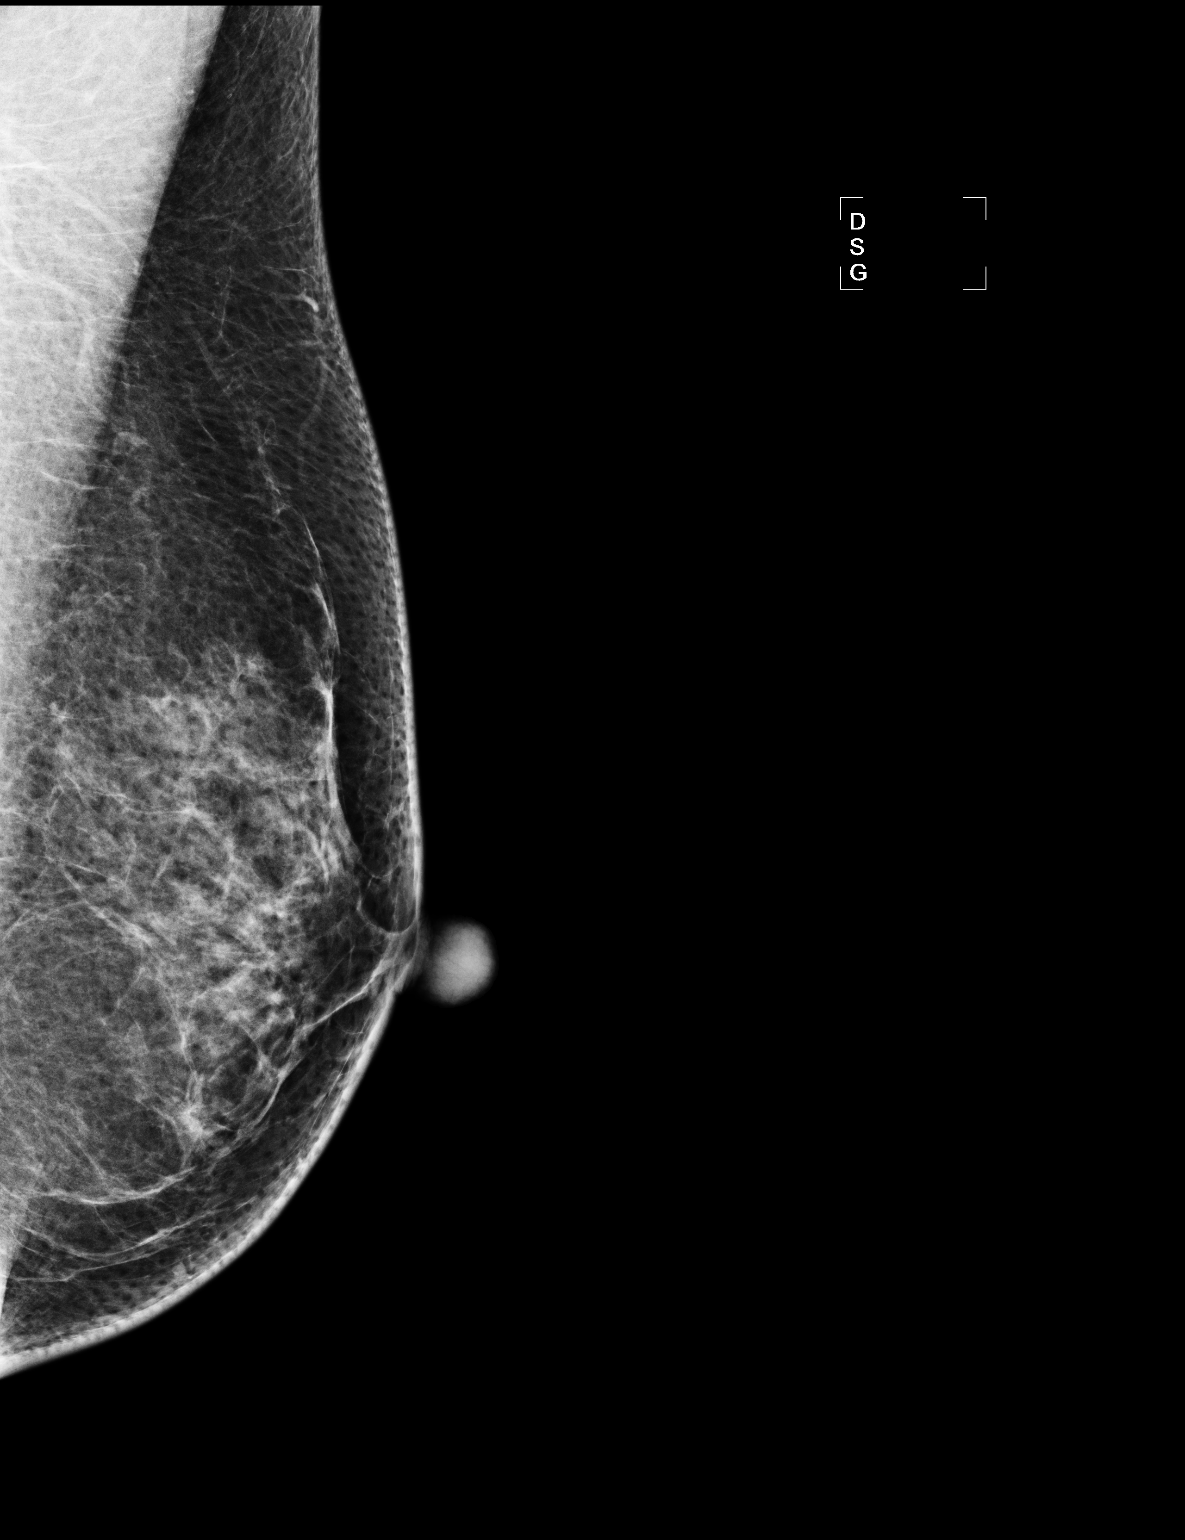

[2 of 2 positions shown; findings below may reference images not displayed]

FINDINGS: There are scattered fibroglandular densities. No
suspicious masses, architectural distortion, or calcifications are
present.

Images were processed with CAD.
IMPRESSION: No specific mammographic evidence of malignancy.

A result letter of this screening mammogram will be mailed directly
to the patient.

RECOMMENDATION:
Screening mammogram in one year. (Code:A7-G-7OZ)

BI-RADS CATEGORY 1:  Negative

## 2015-02-06 ENCOUNTER — Encounter: Payer: Self-pay | Admitting: Gynecology

## 2015-02-06 ENCOUNTER — Ambulatory Visit (INDEPENDENT_AMBULATORY_CARE_PROVIDER_SITE_OTHER): Payer: BLUE CROSS/BLUE SHIELD | Admitting: Gynecology

## 2015-02-06 VITALS — BP 120/76 | Ht 62.5 in | Wt 136.0 lb

## 2015-02-06 DIAGNOSIS — M8580 Other specified disorders of bone density and structure, unspecified site: Secondary | ICD-10-CM | POA: Diagnosis not present

## 2015-02-06 DIAGNOSIS — Z01419 Encounter for gynecological examination (general) (routine) without abnormal findings: Secondary | ICD-10-CM

## 2015-02-06 DIAGNOSIS — Z86010 Personal history of colonic polyps: Secondary | ICD-10-CM

## 2015-02-06 NOTE — Progress Notes (Signed)
[name] 1960-06-13 [id number]   History:    54 y.o.  for annual gyn exam with no complaints today. Patient with past history of colon polyps her last colonoscopy was in 2015 benign polyps were removed she's on a 3 year recall. Her last bone density study in 2015 demonstrated the lowest T score was at the AP spine -1.6. Patient with no previous history of any abnormal Pap smears. Her PCP has been doing her blood work. She is on no hormone replacement therapy. Patient with past history of beta thalassemia  Past medical history,surgical history, family history and social history were all reviewed and documented in the EPIC chart.  Gynecologic History No LMP recorded. Patient is postmenopausal. Contraception: post menopausal status Last Pap: 2015. Results were: normal Last mammogram: 2015. Results were: normal  Obstetric History OB History  Gravida Para Term Preterm AB SAB TAB Ectopic Multiple Living  2 2 2       2     # Outcome Date GA Lbr Len/2nd Weight Sex Delivery Anes PTL Lv  2 Term     M CS-Unspec  N Y  1 Term     M Vag-Spont  N Y       ROS: A ROS was performed and pertinent positives and negatives are included in the history.  GENERAL: No fevers or chills. HEENT: No change in vision, no earache, sore throat or sinus congestion. NECK: No pain or stiffness. CARDIOVASCULAR: No chest pain or pressure. No palpitations. PULMONARY: No shortness of breath, cough or wheeze. GASTROINTESTINAL: No abdominal pain, nausea, vomiting or diarrhea, melena or bright red blood per rectum. GENITOURINARY: No urinary frequency, urgency, hesitancy or dysuria. MUSCULOSKELETAL: No joint or muscle pain, no back pain, no recent trauma. DERMATOLOGIC: No rash, no itching, no lesions. ENDOCRINE: No polyuria, polydipsia, no heat or cold intolerance. No recent change in weight. HEMATOLOGICAL: No anemia or easy bruising or bleeding. NEUROLOGIC: No headache, seizures, numbness, tingling or weakness. PSYCHIATRIC: No  depression, no loss of interest in normal activity or change in sleep pattern.     Exam: chaperone present  BP 120/76 mmHg  Ht 5' 2.5" (1.588 m)  Wt 136 lb (61.689 kg)  BMI 24.46 kg/m2  Body mass index is 24.46 kg/(m^2).  General appearance : Well developed well nourished female. No acute distress HEENT: Eyes: no retinal hemorrhage or exudates,  Neck supple, trachea midline, no carotid bruits, no thyroidmegaly Lungs: Clear to auscultation, no rhonchi or wheezes, or rib retractions  Heart: Regular rate and rhythm, no murmurs or gallops Breast:Examined in sitting and supine position were symmetrical in appearance, no palpable masses or tenderness,  no skin retraction, no nipple inversion, no nipple discharge, no skin discoloration, no axillary or supraclavicular lymphadenopathy Abdomen: no palpable masses or tenderness, no rebound or guarding Extremities: no edema or skin discoloration or tenderness  Pelvic:  Bartholin, Urethra, Skene Glands: Within normal limits             Vagina: No gross lesions or discharge  Cervix: No gross lesions or discharge  Uterus  anteverted, normal size, shape and consistency, non-tender and mobile  Adnexa  Without masses or tenderness  Anus and perineum  normal   Rectovaginal  normal sphincter tone without palpated masses or tenderness             Hemoccult cards provided     Assessment/Plan:  54 y.o. female for annual exam who is due for her mammogram will schedule. Her bone density  study is due next year. Pap smear not done today in accordance to the new guidelines. Patient refused the flu vaccine today. Fecal Hemoccult cards were provided for testing for patient to submit to the office. She was reminded to do her monthly breast exam. We discussed importance of calcium vitamin D and regular exercise for osteoporosis prevention.   Terrance Mass MD, 3:15 PM 02/06/2015

## 2015-02-17 ENCOUNTER — Other Ambulatory Visit: Payer: Self-pay

## 2015-02-17 DIAGNOSIS — Z1231 Encounter for screening mammogram for malignant neoplasm of breast: Secondary | ICD-10-CM

## 2015-02-24 ENCOUNTER — Other Ambulatory Visit: Payer: BLUE CROSS/BLUE SHIELD | Admitting: Anesthesiology

## 2015-02-24 DIAGNOSIS — Z1211 Encounter for screening for malignant neoplasm of colon: Secondary | ICD-10-CM

## 2015-02-25 ENCOUNTER — Ambulatory Visit
Admission: RE | Admit: 2015-02-25 | Discharge: 2015-02-25 | Disposition: A | Discharge status: Home / Self Care | Payer: BLUE CROSS/BLUE SHIELD | Source: Ambulatory Visit

## 2015-02-25 DIAGNOSIS — Z1231 Encounter for screening mammogram for malignant neoplasm of breast: Secondary | ICD-10-CM

## 2015-06-01 ENCOUNTER — Other Ambulatory Visit: Payer: Self-pay | Admitting: Internal Medicine

## 2015-06-01 DIAGNOSIS — R519 Headache, unspecified: Secondary | ICD-10-CM

## 2015-06-01 DIAGNOSIS — H93A9 Pulsatile tinnitus, unspecified ear: Secondary | ICD-10-CM

## 2015-06-01 DIAGNOSIS — R51 Headache: Principal | ICD-10-CM

## 2015-06-01 DIAGNOSIS — H9311 Tinnitus, right ear: Secondary | ICD-10-CM

## 2015-06-12 ENCOUNTER — Ambulatory Visit
Admission: RE | Admit: 2015-06-12 | Discharge: 2015-06-12 | Disposition: A | Discharge status: Home / Self Care | Payer: BLUE CROSS/BLUE SHIELD | Source: Ambulatory Visit | Attending: Internal Medicine | Admitting: Internal Medicine

## 2015-06-12 DIAGNOSIS — R51 Headache: Principal | ICD-10-CM

## 2015-06-12 DIAGNOSIS — R519 Headache, unspecified: Secondary | ICD-10-CM

## 2015-06-12 DIAGNOSIS — H9311 Tinnitus, right ear: Secondary | ICD-10-CM

## 2015-06-12 MED ORDER — GADOBENATE DIMEGLUMINE 529 MG/ML IV SOLN
10.0000 mL | Freq: Once | INTRAVENOUS | Status: AC | PRN
  Administered 2015-06-12: 10 mL via INTRAVENOUS

## 2015-06-23 ENCOUNTER — Ambulatory Visit: Payer: BLUE CROSS/BLUE SHIELD | Attending: Internal Medicine | Admitting: Physical Therapy

## 2015-06-23 DIAGNOSIS — M542 Cervicalgia: Secondary | ICD-10-CM | POA: Diagnosis present

## 2015-06-23 DIAGNOSIS — M6248 Contracture of muscle, other site: Secondary | ICD-10-CM | POA: Insufficient documentation

## 2015-06-23 DIAGNOSIS — M62838 Other muscle spasm: Secondary | ICD-10-CM

## 2015-06-23 DIAGNOSIS — R29818 Other symptoms and signs involving the nervous system: Secondary | ICD-10-CM | POA: Insufficient documentation

## 2015-06-23 DIAGNOSIS — M5382 Other specified dorsopathies, cervical region: Secondary | ICD-10-CM

## 2015-06-23 NOTE — Patient Instructions (Signed)
   Kristoffer Leamon PT, DPT, LAT, ATC  [facility] Phone: [phone]     

## 2015-06-23 NOTE — Therapy (Signed)
[place], [place], [address] Phone: [phone]   Fax:  [phone]  Physical Therapy Evaluation  Patient Details  Name: VELECIA KEGLER MRN: [id number] Date of Birth: 1960-07-05 Referring Provider: Murvin Donning MD  Encounter Date: 06/23/2015      PT End of Session - 06/23/15 1439    Visit Number 1   Number of Visits 12   Date for PT Re-Evaluation 08/04/15   PT Start Time 1410   PT Stop Time 1455   PT Time Calculation (min) 45 min   Activity Tolerance Patient tolerated treatment well   Behavior During Therapy [facility] for tasks assessed/performed      Past Medical History  Diagnosis Date  . NSVD (normal spontaneous vaginal delivery)   . HSV-2 (herpes simplex virus 2) infection   . Beta thalassemia minor     MICROCYTIC ANEMIA    Past Surgical History  Procedure Laterality Date  . Cesarean section  1989    FTP  . Resectoscopic polypectomy  2010  . Colonoscopy w/ polypectomy  2013    6  POLYPS ALL BENIGN  . Hemorrhoid surgery      There were no vitals filed for this visit.  Visit Diagnosis:  Neck pain - Plan: PT plan of care cert/re-cert  Muscle spasms of neck - Plan: PT plan of care cert/re-cert  Decreased ROM of intervertebral discs of cervical spine - Plan: PT plan of care cert/re-cert  Abnormal Romberg test - Plan: PT plan of care cert/re-cert      Subjective Assessment - 06/23/15 1414    Subjective pt is a 55 y.o F with CC of neck pain that has been going on for  couple of years that slowly progressed insidioulsy. she reports numbness in the R side of her face that has been present the last couple of months, and request to get it checked and has an appointment with an nuerologist. She reports some numbness in to bil upper traps R>L .she reports having difficulty with sleeping due to comfort and pain in the neck.. She has been getting massages which helps but notes that she has alot of knots in  it. She denies any HA, dizziness, nausea or tinnities, or diplopia.    How long can you sit comfortably? unlimited   How long can you stand comfortably? unlimited   How long can you walk comfortably? unlimited   Diagnostic tests 06/12/2015 MRI: stenosis of the cervical spine    Patient Stated Goals decrease muscle tightness and pain.    Currently in Pain? Yes   Pain Score 4    Pain Location Neck   Pain Orientation Right;Left   Pain Descriptors / Indicators Sore  numbness in the face/ pins and needles   Pain Type Chronic pain   Pain Radiating Towards bil upper traps   Pain Onset More than a month ago   Pain Frequency Constant   Aggravating Factors  Unknown   Pain Relieving Factors massage            [facility] PT Assessment - 06/23/15 1357    Assessment   Medical Diagnosis cervical stenosis   Referring Provider Murvin Donning MD   Onset Date/Surgical Date --  couple of years   Hand Dominance Right   Next MD Visit make one as PRN   Prior Therapy no   Precautions   Precautions None   Restrictions   Weight Bearing Restrictions No   Balance Screen  Has the patient fallen in the past 6 months No   Has the patient had a decrease in activity level because of a fear of falling?  No   Is the patient reluctant to leave their home because of a fear of falling?  No   Home Environment   Living Environment Private residence   Living Arrangements Spouse/significant other;Children   Available Help at Discharge Available 24 hours/day   Type of Home House   Home Access Level entry   Entrance Stairs-Number of Steps --   Entrance Stairs-Rails Right   Home Layout Two level   Alternate Level Stairs-Number of Steps 14   Prior Function   Level of Independence Independent;Independent with basic ADLs   Vocation Full time Land   Vocation Requirements prolonged standing/ walking   Leisure watching tv, playing games   Cognition   Overall Cognitive Status Within Functional  Limits for tasks assessed   Observation/Other Assessments   Focus on Therapeutic Outcomes (FOTO)  98% limited  predicted 88%   Posture/Postural Control   Posture/Postural Control Postural limitations   Postural Limitations Rounded Shoulders;Forward head   ROM / Strength   AROM / PROM / Strength AROM;PROM;Strength   AROM   AROM Assessment Site Cervical   Cervical Flexion 30   Cervical Extension 40   Cervical - Right Side Bend 30  pt reported crunchy sensation during movement   Cervical - Left Side Bend 30  pt reported crunchy sensation during movement   Cervical - Right Rotation 40   Cervical - Left Rotation 35   Palpation   Spinal mobility hypomobility C2-C7 Passive accessory intervertebral motions with P>A    Palpation comment spasm of bil upper traps and levator scapulae R>L, tightness of sub-occipitals   Special Tests    Special Tests Cervical   Cervical Tests Spurling's;Dictraction;Vertebral Artery Test   Spurling's   Findings Negative   Distraction Test   Findngs Negative   Vertebral Artery Test    Findings Negative                           PT Education - 06/23/15 1454    Education provided Yes   Education Details evaluation findings, POC, goals, HEP   Person(s) Educated Patient   Methods Explanation   Comprehension Verbalized understanding          PT Short Term Goals - 06/23/15 1500    PT SHORT TERM GOAL #1   Title pt will be I with inital HEP (07/14/2015)   Time 3   Period Weeks   Status New   PT SHORT TERM GOAL #2   Title pt will demonstrate proper posture and lifting mechanics to decrease tightness and neck reinjury (07/14/2015)   Time 3   Period Weeks   Status New           PT Long Term Goals - 06/23/15 1537    PT LONG TERM GOAL #1   Title pt will be I with all HEP as of last visit (08/04/2015)   Time 6   Period Weeks   Status New   PT LONG TERM GOAL #2   Title pt will demonstrate decreased spasm in the neck and  surrounding musculature to assist with pain relief to </=  2/10 pain (08/04/2015)   Time 6   Period Weeks   Status New   PT LONG TERM GOAL #3   Title pt will improver cervical  mobility by >/= 5 degrees in all planes to assist with ADLs and safety during driving ([id number])   Time 6   Period Weeks   Status New   PT LONG TERM GOAL #4   Title pt will be able to resume her personal gym/ exercise routine with </= 1/10 pain in the shoulder / neck per pt personal goals (08/04/2015)   Time 6   Period Weeks   Status New               Plan - 06/23/15 1455    Clinical Impression Statement Tanza presents to [facility] as a moderate complexity evaluation for cervical spine pain that has been going on for a couple of years. She reports recent face numbness on the R that started within the last couple of months that she is planning to see a nuerologis for on 06/25/2015. She demonstrates limited cervical mobility in all planes but no report of pain or tightness just crepeitis during rotations and side bending, she demonstrates hypomobility of intervertebral mobility of c2-c7, and spasm of bil upper traps, sub-occipitals and levator scapuale, and rhomboids. She would benefit from physical therapy to decrease muscle tightness and improve cervical mobility and overal function by addressing the impairments listed.    Pt will benefit from skilled therapeutic intervention in order to improve on the following deficits Pain;Improper body mechanics;Postural dysfunction;Hypomobility;Decreased endurance;Increased muscle spasms;Decreased activity tolerance   Rehab Potential Good   PT Frequency 2x / week   PT Duration 6 weeks   PT Treatment/Interventions Passive range of motion;Dry needling;Taping;Iontophoresis 4mg /ml Dexamethasone;Electrical Stimulation;ADLs/Self Care Home Management;Cryotherapy;Moist Heat;Therapeutic exercise;Therapeutic activities;Ultrasound;Patient/family education;Manual techniques   PT Next Visit Plan  assess and review HEP, manual for muscle tightness, posture education    PT Home Exercise Plan upper trap and levator scap stretch, chin tucks, rows   Consulted and Agree with Plan of Care Patient         Problem List Patient Active Problem List   Diagnosis Date Noted  . Osteopenia 02/04/2014  . Beta thalassemia minor 11/27/2012  . Menopause 11/16/2011   [name] PT, DPT, LAT, ATC  06/23/2015  3:47 PM     [facility] [address] [place], [place], [address] Phone: [phone]   Fax:  [phone]  Name: VIANCA GARDENHIRE MRN: [id number] Date of Birth: 03/05/1961

## 2015-06-25 ENCOUNTER — Encounter: Payer: Self-pay | Admitting: Neurology

## 2015-06-25 ENCOUNTER — Ambulatory Visit (INDEPENDENT_AMBULATORY_CARE_PROVIDER_SITE_OTHER): Payer: BLUE CROSS/BLUE SHIELD | Admitting: Neurology

## 2015-06-25 ENCOUNTER — Ambulatory Visit: Payer: BLUE CROSS/BLUE SHIELD | Admitting: Physical Therapy

## 2015-06-25 VITALS — BP 135/89 | HR 64 | Ht 63.0 in | Wt 137.5 lb

## 2015-06-25 DIAGNOSIS — R202 Paresthesia of skin: Secondary | ICD-10-CM

## 2015-06-25 DIAGNOSIS — H93A1 Pulsatile tinnitus, right ear: Secondary | ICD-10-CM

## 2015-06-25 DIAGNOSIS — R29818 Other symptoms and signs involving the nervous system: Secondary | ICD-10-CM

## 2015-06-25 DIAGNOSIS — M542 Cervicalgia: Secondary | ICD-10-CM

## 2015-06-25 DIAGNOSIS — M5382 Other specified dorsopathies, cervical region: Secondary | ICD-10-CM

## 2015-06-25 DIAGNOSIS — M62838 Other muscle spasm: Secondary | ICD-10-CM

## 2015-06-25 HISTORY — DX: Paresthesia of skin: R20.2

## 2015-06-25 NOTE — Progress Notes (Signed)
Reason for visit: Right facial numbness  Referring physician: Dr. Zipporah Plants [name] is a 55 y.o. female  History of present illness:  [name] is a 55 year old right-handed Asian female with a history of intermittent right facial numbness that has been present for several months. The patient indicates that the numbness may be on the right lower face, and will come and go throughout the day. The patient denies any actual pain in the face. She reports no visual changes, double vision or loss of vision. The patient has not had any problems with speech or swallowing. She has had some neck discomfort and crepitus in the neck, she currently is getting physical therapy for this. She does not relate the neck and shoulder discomfort to the facial numbness. She has not had any weakness of the face. She indicates that when she lies down at night she may have a rushing sensation that goes up the right neck into the head and face. She has undergone MRI evaluation of the brain that has shown some nonspecific white matter changes, a small microhemorrhage in the left cerebellum consistent with small vessel disease. The patient denies any weakness or numbness of the extremities, she denies any balance issues or difficulty controlling the bowels or the bladder. She has no prior history of hypertension. She comes to this office for an evaluation.  Past Medical History  Diagnosis Date  . NSVD (normal spontaneous vaginal delivery)   . HSV-2 (herpes simplex virus 2) infection   . Beta thalassemia minor     MICROCYTIC ANEMIA  . Paresthesia 06/25/2015    Past Surgical History  Procedure Laterality Date  . Cesarean section  1989    FTP  . Resectoscopic polypectomy  2010  . Colonoscopy w/ polypectomy  2013    6  POLYPS ALL BENIGN  . Hemorrhoid surgery      Family History  Problem Relation Age of Onset  . Diabetes Father   . Healthy Sister   . Healthy Brother   . Healthy Brother     Social history:   reports that she has never smoked. She has never used smokeless tobacco. She reports that she does not drink alcohol or use illicit drugs.  Medications:  Prior to Admission medications   Medication Sig Start Date End Date Taking? Authorizing Provider  Multiple Vitamin (MULTIVITAMIN) tablet Take 1 tablet by mouth daily.   Yes Historical Provider, MD     No Known Allergies  ROS:  Out of a complete 14 system review of symptoms, the patient complains only of the following symptoms, and all other reviewed systems are negative.  Numbness  Blood pressure 135/89, pulse 64, height 5\' 3"  (1.6 m), weight 137 lb 8 oz (62.37 kg).  Physical Exam  General: The patient is alert and cooperative at the time of the examination.  Eyes: Pupils are equal, round, and reactive to light. Discs are flat bilaterally.  Neck: The neck is supple, no carotid bruits are noted.  Respiratory: The respiratory examination is clear.  Cardiovascular: The cardiovascular examination reveals a regular rate and rhythm, no obvious murmurs or rubs are noted.  Neuromuscular: Range of movement of the cervical spine is full.  Skin: Extremities are without significant edema.  Neurologic Exam  Mental status: The patient is alert and oriented x 3 at the time of the examination. The patient has apparent normal recent and remote memory, with an apparently normal attention span and concentration ability.  Cranial nerves: Facial  symmetry is present. There is good sensation of the face to pinprick and soft touch bilaterally. The strength of the facial muscles and the muscles to head turning and shoulder shrug are normal bilaterally. Speech is well enunciated, no aphasia or dysarthria is noted. Extraocular movements are full. Visual fields are full. The tongue is midline, and the patient has symmetric elevation of the soft palate. No obvious hearing deficits are noted.  Motor: The motor testing reveals 5 over 5 strength of all 4  extremities. Good symmetric motor tone is noted throughout.  Sensory: Sensory testing is intact to pinprick, soft touch, vibration sensation, and position sense on all 4 extremities. No evidence of extinction is noted.  Coordination: Cerebellar testing reveals good finger-nose-finger and heel-to-shin bilaterally.  Gait and station: Gait is normal. Tandem gait is normal. Romberg is negative. No drift is seen.  Reflexes: Deep tendon reflexes are symmetric and normal bilaterally. Toes are downgoing bilaterally.   MRI brain 06/12/15:  IMPRESSION: 1. Moderately advanced for age but nonspecific cerebral white matter signal changes. Differential considerations include accelerated/hereditary small vessel ischemia, sequelae of trauma, hypercoagulable state, vasculitis, migraines, prior infection or demyelination. 2. Solitary chronic microhemorrhage in the left cerebellum, also nonspecific but the other might favor chronic small vessel disease for #1. 3. No discrete lesion identified to explain right facial numbness. 4. Degenerative cervical spinal stenosis suspected at C3-C4.  * MRI scan images were reviewed online. I agree with the written report.    Assessment/Plan:  1. Subjective right facial numbness  2. Cervical spondylosis  The patient is having some intermittent mild numbness and tingling on the right lower face. MRI of the brain is slightly abnormal associated with white matter changes. The patient does not have history of hypertension or prior migraine headache. The patient will be set up for a carotid Doppler study given the episodes of rushing sensation of the right neck and face. She will be sent for blood work today. I have asked her to go on low-dose aspirin. She will follow-up through this office if needed. We will contact her regarding the test results above.  Jill Alexanders MD 06/25/2015 5:19 PM  [facility] [address] [place],  [place] [address]  Phone [phone] Fax [phone]

## 2015-06-25 NOTE — Patient Instructions (Signed)
Paresthesia Paresthesia is an abnormal burning or prickling sensation. This sensation is generally felt in the hands, arms, legs, or feet. However, it may occur in any part of the body. Usually, it is not painful. The feeling may be described as:  Tingling or numbness.  Pins and needles.  Skin crawling.  Buzzing.  Limbs falling asleep.  Itching. Most people experience temporary (transient) paresthesia at some time in their lives. Paresthesia may occur when you breathe too quickly (hyperventilation). It can also occur without any apparent cause. Commonly, paresthesia occurs when pressure is placed on a nerve. The sensation quickly goes away after the pressure is removed. For some people, however, paresthesia is a long-lasting (chronic) condition that is caused by an underlying disorder. If you continue to have paresthesia, you may need further medical evaluation. HOME CARE INSTRUCTIONS Watch your condition for any changes. Taking the following actions may help to lessen any discomfort that you are feeling:  Avoid drinking alcohol.  Try acupuncture or massage to help relieve your symptoms.  Keep all follow-up visits as directed by your health care provider. This is important. SEEK MEDICAL CARE IF:  You continue to have episodes of paresthesia.  Your burning or prickling feeling gets worse when you walk.  You have pain, cramps, or dizziness.  You develop a rash. SEEK IMMEDIATE MEDICAL CARE IF:  You feel weak.  You have trouble walking or moving.  You have problems with speech, understanding, or vision.  You feel confused.  You cannot control your bladder or bowel movements.  You have numbness after an injury.  You faint.   This information is not intended to replace advice given to you by your health care provider. Make sure you discuss any questions you have with your health care provider.   Document Released: 04/15/2002 Document Revised: 09/09/2014 Document Reviewed:  04/21/2014 Elsevier Interactive Patient Education 2016 [employer] Inc[employer]  

## 2015-06-25 NOTE — Therapy (Signed)
[name], [place], [address] Phone: [phone]   Fax:  [phone]  Physical Therapy Treatment  Patient Details  Name: [name] MRN: [id number] Date of Birth: 18-Mar-1961 Referring Provider: Murvin Donning MD  Encounter Date: 06/25/2015      PT End of Session - 06/25/15 1331    Visit Number 2   Number of Visits 12   Date for PT Re-Evaluation 08/04/15   PT Start Time 0130   PT Stop Time 0230   PT Time Calculation (min) 60 min      Past Medical History  Diagnosis Date  . NSVD (normal spontaneous vaginal delivery)   . HSV-2 (herpes simplex virus 2) infection   . Beta thalassemia minor     MICROCYTIC ANEMIA    Past Surgical History  Procedure Laterality Date  . Cesarean section  1989    FTP  . Resectoscopic polypectomy  2010  . Colonoscopy w/ polypectomy  2013    6  POLYPS ALL BENIGN  . Hemorrhoid surgery      There were no vitals filed for this visit.  Visit Diagnosis:  Muscle spasms of neck  Decreased ROM of intervertebral discs of cervical spine  Abnormal Romberg test  Neck pain      Subjective Assessment - 06/25/15 1330    Subjective I feel more relief. My pain is constant. I just ignore it.    Currently in Pain? Yes   Pain Score 4    Pain Location Neck   Aggravating Factors  sleeping in left side.    Pain Relieving Factors massage                         [facility] Adult PT Treatment/Exercise - 06/25/15 0001    Self-Care   Self-Care Posture   Posture Sitting posture discussed with patient and how it plays a role in pain and tightness   Neck Exercises: Seated   Neck Retraction 10 reps   Shoulder Exercises: Standing   Row 20 reps;Theraband   Theraband Level (Shoulder Row) Level 2 (Red)   Modalities   Modalities Ultrasound;Moist Heat   Moist Heat Therapy   Number Minutes Moist Heat 15 Minutes   Moist Heat Location Cervical   Ultrasound   Ultrasound  Location bilateral upper traps /levator   Ultrasound Parameters 1 Mhz 1.6 w/cm2 x 100% x 8 min   Ultrasound Goals Pain   Manual Therapy   Manual Therapy Soft tissue mobilization   Soft tissue mobilization IASTM bilateral upper trap and levator    Neck Exercises: Stretches   Upper Trapezius Stretch 3 reps;30 seconds   Levator Stretch 3 reps;30 seconds                  PT Short Term Goals - 06/25/15 1338    PT SHORT TERM GOAL #1   Title pt will be I with inital HEP (07/14/2015)   Time 3   Period Weeks   Status Achieved   PT SHORT TERM GOAL #2   Title pt will demonstrate proper posture and lifting mechanics to decrease tightness and neck reinjury (07/14/2015)   Time 3   Period Weeks   Status On-going           PT Long Term Goals - 06/23/15 1537    PT LONG TERM GOAL #1   Title pt will be I with all HEP as of last visit (08/04/2015)  Time 6   Period Weeks   Status New   PT LONG TERM GOAL #2   Title pt will demonstrate decreased spasm in the neck and surrounding musculature to assist with pain relief to </=  2/10 pain (08/04/2015)   Time 6   Period Weeks   Status New   PT LONG TERM GOAL #3   Title pt will improver cervical mobility by >/= 5 degrees in all planes to assist with ADLs and safety during driving (9/45/0388)   Time 6   Period Weeks   Status New   PT LONG TERM GOAL #4   Title pt will be able to resume her personal gym/ exercise routine with </= 1/10 pain in the shoulder / neck per pt personal goals (08/04/2015)   Time 6   Period Weeks   Status New               Plan - 06/25/15 1338    Clinical Impression Statement Pt sees neurologist this afternoon. Ultrasound used to soften soft tissue followed by soft tissue mobilization to bilateral upper traps as well as HMP. Posture Education provided to patient regarding improtance of proper sitting posture with ADL. Pt reprots he has been adjusting her posture with playing games on her phone. She reports   decreased pain with sleep after doing stretches prior to bed time. I with initial HEP. STG#1 MET.   PT Next Visit Plan assess benefit of last treatment, what did neurologist say?        Problem List Patient Active Problem List   Diagnosis Date Noted  . Osteopenia 02/04/2014  . Beta thalassemia minor 11/27/2012  . Menopause 11/16/2011    Dorene Ar, PTA 06/25/2015, 2:54 PM  [facility] [address] [place], [place], [address] Phone: [phone]   Fax:  [phone]  Name: [name] MRN: [id number] Date of Birth: August 25, 1960

## 2015-06-27 LAB — RPR: RPR: NONREACTIVE

## 2015-06-27 LAB — ANGIOTENSIN CONVERTING ENZYME: ANGIO CONVERT ENZYME: 32 U/L (ref 14–82)

## 2015-06-27 LAB — SEDIMENTATION RATE: SED RATE: 12 mm/h (ref 0–40)

## 2015-06-27 LAB — ANA W/REFLEX: Anti Nuclear Antibody(ANA): NEGATIVE

## 2015-06-27 LAB — B. BURGDORFI ANTIBODIES

## 2015-06-29 ENCOUNTER — Encounter: Payer: BLUE CROSS/BLUE SHIELD | Admitting: Physical Therapy

## 2015-06-30 ENCOUNTER — Ambulatory Visit: Payer: BLUE CROSS/BLUE SHIELD | Admitting: Physical Therapy

## 2015-06-30 DIAGNOSIS — M5382 Other specified dorsopathies, cervical region: Secondary | ICD-10-CM

## 2015-06-30 DIAGNOSIS — M542 Cervicalgia: Secondary | ICD-10-CM | POA: Diagnosis not present

## 2015-06-30 DIAGNOSIS — R29818 Other symptoms and signs involving the nervous system: Secondary | ICD-10-CM

## 2015-06-30 DIAGNOSIS — M62838 Other muscle spasm: Secondary | ICD-10-CM

## 2015-06-30 NOTE — Patient Instructions (Signed)

## 2015-06-30 NOTE — Therapy (Signed)
[place], [place], [address] Phone: [phone]   Fax:  [phone]  Physical Therapy Treatment  Patient Details  Name: [name] MRN: [id number] Date of Birth: Jun 17, 1960 Referring Provider: Murvin Donning MD  Encounter Date: 06/30/2015      PT End of Session - 06/30/15 1412    Visit Number 3   Number of Visits 12   Date for PT Re-Evaluation 08/04/15   PT Start Time 1330   PT Stop Time 1422   PT Time Calculation (min) 52 min   Activity Tolerance Patient tolerated treatment well   Behavior During Therapy [facility] for tasks assessed/performed      Past Medical History  Diagnosis Date  . NSVD (normal spontaneous vaginal delivery)   . HSV-2 (herpes simplex virus 2) infection   . Beta thalassemia minor     MICROCYTIC ANEMIA  . Paresthesia 06/25/2015    Past Surgical History  Procedure Laterality Date  . Cesarean section  1989    FTP  . Resectoscopic polypectomy  2010  . Colonoscopy w/ polypectomy  2013    6  POLYPS ALL BENIGN  . Hemorrhoid surgery      There were no vitals filed for this visit.  Visit Diagnosis:  Muscle spasms of neck  Decreased ROM of intervertebral discs of cervical spine  Abnormal Romberg test  Neck pain      Subjective Assessment - 06/30/15 1335    Subjective "I am still feeling tight but the stretching is helping"   Currently in Pain? No/denies            [facility] PT Assessment - 06/30/15 0001    AROM   Cervical - Left Rotation 30   improved to 48 following TPDN and instrument assisted STM                     [facility] Adult PT Treatment/Exercise - 06/30/15 1408    Neck Exercises: Seated   Neck Retraction 10 reps   Other Seated Exercise scapular retraction 2 x 10   Moist Heat Therapy   Number Minutes Moist Heat 10 Minutes   Moist Heat Location Shoulder  L in supine   Manual Therapy   Manual Therapy Myofascial release;Joint mobilization;Other  (comment)   Joint Mobilization thoracic grade 3 P>A T1-T7 joint mobs   Soft tissue mobilization IASTM L upper trap and levator scapuale   Myofascial Release rolling and stretching over the L upper trap and levator scapulae   Other Manual Therapy contract/ relax stretch of L upper trap with 10 sec contraciton x 30 sec stretch           Trigger Point Dry Needling - 06/30/15 1410    Consent Given? Yes   Education Handout Provided Yes   Muscles Treated Upper Body Upper trapezius;Levator scapulae   Upper Trapezius Response Twitch reponse elicited;Palpable increased muscle length   Levator Scapulae Response Palpable increased muscle length;Twitch response elicited              PT Education - 06/30/15 1411    Education provided Yes   Education Details dry needling education   Person(s) Educated Patient   Methods Explanation   Comprehension Verbalized understanding          PT Short Term Goals - 06/25/15 1338    PT SHORT TERM GOAL #1   Title pt will be I with inital HEP (07/14/2015)   Time 3   Period Weeks  Status Achieved   PT SHORT TERM GOAL #2   Title pt will demonstrate proper posture and lifting mechanics to decrease tightness and neck reinjury (07/14/2015)   Time 3   Period Weeks   Status On-going           PT Long Term Goals - 06/23/15 1537    PT LONG TERM GOAL #1   Title pt will be I with all HEP as of last visit (08/04/2015)   Time 6   Period Weeks   Status New   PT LONG TERM GOAL #2   Title pt will demonstrate decreased spasm in the neck and surrounding musculature to assist with pain relief to </=  2/10 pain (08/04/2015)   Time 6   Period Weeks   Status New   PT LONG TERM GOAL #3   Title pt will improver cervical mobility by >/= 5 degrees in all planes to assist with ADLs and safety during driving ([id number])   Time 6   Period Weeks   Status New   PT LONG TERM GOAL #4   Title pt will be able to resume her personal gym/ exercise routine with </= 1/10  pain in the shoulder / neck per pt personal goals (08/04/2015)   Time [place] - 06/30/15 1412    Clinical Impression Statement [name] reports no pain today but states she still has some tightness in the L upper trap region. she provided consent for dry needling of the upper trap and levator scapulae. she improve L cervical rotation from 30 degrees to 48 degrees following TPDN and insrument assisted STM. Focused today on manual to calm down spasm of the L shoulder musculature. Used MHP post session to decrease soreness following DN   PT Next Visit Plan assess dry needling, stretching, thoracic mobility, scapular stabilizers, posture education, modalities PRN,    Consulted and Agree with Plan of Care Patient        Problem List Patient Active Problem List   Diagnosis Date Noted  . Paresthesia 06/25/2015  . Osteopenia 02/04/2014  . Beta thalassemia minor 11/27/2012  . Menopause 11/16/2011   [name] PT, DPT, LAT, ATC  06/30/2015  2:25 PM     [facility] [address] [place], [place], [address] Phone: [phone]   Fax:  [phone]  Name: [name] MRN: [id number] Date of Birth: 19-Sep-1960

## 2015-07-02 ENCOUNTER — Ambulatory Visit: Payer: BLUE CROSS/BLUE SHIELD | Admitting: Physical Therapy

## 2015-07-02 ENCOUNTER — Ambulatory Visit (INDEPENDENT_AMBULATORY_CARE_PROVIDER_SITE_OTHER): Payer: BLUE CROSS/BLUE SHIELD

## 2015-07-02 DIAGNOSIS — H93A1 Pulsatile tinnitus, right ear: Secondary | ICD-10-CM

## 2015-07-02 DIAGNOSIS — R202 Paresthesia of skin: Secondary | ICD-10-CM

## 2015-07-02 DIAGNOSIS — M542 Cervicalgia: Secondary | ICD-10-CM

## 2015-07-02 DIAGNOSIS — M5382 Other specified dorsopathies, cervical region: Secondary | ICD-10-CM

## 2015-07-02 DIAGNOSIS — M62838 Other muscle spasm: Secondary | ICD-10-CM

## 2015-07-02 NOTE — Therapy (Signed)
[place], [place], [address] Phone: [phone]   Fax:  [phone]  Physical Therapy Treatment  Patient Details  Name: [name] MRN: [id number] Date of Birth: [birth year] Referring Provider: Murvin Donning MD  Encounter Date: 07/02/2015      PT End of Session - 07/02/15 1408    Visit Number 4   Number of Visits 12   Date for PT Re-Evaluation 08/04/15   PT Start Time 1330   PT Stop Time 1419   PT Time Calculation (min) 49 min   Activity Tolerance Patient tolerated treatment well   Behavior During Therapy [facility] for tasks assessed/performed      Past Medical History  Diagnosis Date  . NSVD (normal spontaneous vaginal delivery)   . HSV-2 (herpes simplex virus 2) infection   . Beta thalassemia minor     MICROCYTIC ANEMIA  . Paresthesia 06/25/2015    Past Surgical History  Procedure Laterality Date  . Cesarean section  1989    FTP  . Resectoscopic polypectomy  2010  . Colonoscopy w/ polypectomy  2013    6  POLYPS ALL BENIGN  . Hemorrhoid surgery      There were no vitals filed for this visit.  Visit Diagnosis:  Muscle spasms of neck  Decreased ROM of intervertebral discs of cervical spine  Neck pain      Subjective Assessment - 07/02/15 1330    Subjective "I am feeling good, less sore but still some tightness" pt reports that the needling has helped.    Currently in Pain? No/denies   Pain Location Neck   Pain Orientation Left                         [facility] Adult PT Treatment/Exercise - 07/02/15 0001    Neck Exercises: Seated   Neck Retraction 10 reps;5 secs  in supine   Shoulder Exercises: Supine   Protraction AROM;Strengthening;Both;15 reps;Weights  with bar   Protraction Weight (lbs) 4   Horizontal ABduction AROM;Strengthening;Both;10 reps;Theraband   Theraband Level (Shoulder Horizontal ABduction) Level 2 (Red)   Shoulder Exercises: Standing   Row Theraband;15  reps   Theraband Level (Shoulder Row) Level 3 (Green)   Other Standing Exercises wall-push ups x 10   Shoulder Exercises: ROM/Strengthening   UBE (Upper Arm Bike) L 1 x 5 min  changing direction at 2:30   Moist Heat Therapy   Number Minutes Moist Heat 10 Minutes   Moist Heat Location Shoulder   Manual Therapy   Joint Mobilization thoracic grade 3 P>A T1-T7 joint mobs   Soft tissue mobilization IASTM L upper trap and levator scapuale   Myofascial Release rolling and stretching over the L upper trap and levator scapulae   Other Manual Therapy contract/ relax stretch of L upper trap with 10 sec contraciton x 30 sec stretch    Neck Exercises: Stretches   Upper Trapezius Stretch 2 reps;30 seconds   Levator Stretch 2 reps;30 seconds          Trigger Point Dry Needling - 07/02/15 1355    Consent Given? Yes   Education Handout Provided Yes   Muscles Treated Upper Body Upper trapezius;Levator scapulae   Upper Trapezius Response Twitch reponse elicited;Palpable increased muscle length   Levator Scapulae Response Twitch response elicited;Palpable increased muscle length  L sided only              PT Education - 07/02/15 1408  Education provided Yes   Education Details updated HEP   Person(s) Educated Patient   Methods Explanation   Comprehension Verbalized understanding          PT Short Term Goals - 07/02/15 1410    PT SHORT TERM GOAL #1   Title pt will be I with inital HEP (07/14/2015)   Time 3   Period Weeks   Status Achieved   PT SHORT TERM GOAL #2   Title pt will demonstrate proper posture and lifting mechanics to decrease tightness and neck reinjury (07/14/2015)   Time 3   Period Weeks   Status Achieved           PT Long Term Goals - 07/02/15 1411    PT LONG TERM GOAL #1   Title pt will be I with all HEP as of last visit (08/04/2015)   Time 6   Period Weeks   Status On-going   PT LONG TERM GOAL #2   Title pt will demonstrate decreased spasm in the neck  and surrounding musculature to assist with pain relief to </=  2/10 pain (08/04/2015)   Time 6   Period Weeks   Status On-going   PT LONG TERM GOAL #3   Title pt will improver cervical mobility by >/= 5 degrees in all planes to assist with ADLs and safety during driving ([id number])   Time 6   Period Weeks   Status On-going   PT LONG TERM GOAL #4   Title pt will be able to resume her personal gym/ exercise routine with </= 1/10 pain in the shoulder / neck per pt personal goals (08/04/2015)   Time 6   Period Weeks   Status On-going               Plan - 07/02/15 1408    Clinical Impression Statement [name] reports that the dry needling really helped last session. She wanted to try another bout of dry needling of the upper trap and levator with mulitple palpable twitches and decreased tension follwoing thoracic mobs and myofascial release and instrument assisted STM. she reported no pain with exercise today and following activity.. Plan to work toward exercise next sessions.    PT Next Visit Plan assess dry needling, stretching, thoracic mobility, scapular stabilizers, posture education, modalities PRN, thoracic extensions    PT Home Exercise Plan horizontal abduction, wall-push-ups   Consulted and Agree with Plan of Care Patient        Problem List Patient Active Problem List   Diagnosis Date Noted  . Paresthesia 06/25/2015  . Osteopenia 02/04/2014  . Beta thalassemia minor 11/27/2012  . Menopause 11/16/2011   [name] PT, DPT, LAT, ATC  07/02/2015  2:21 PM      [place] [facility] [address] [place], [place], [address] Phone: [phone]   Fax:  [phone]  Name: [name] MRN: [id number] Date of Birth: 07/30/1960

## 2015-07-07 ENCOUNTER — Ambulatory Visit: Payer: BLUE CROSS/BLUE SHIELD | Admitting: Physical Therapy

## 2015-07-07 DIAGNOSIS — R29818 Other symptoms and signs involving the nervous system: Secondary | ICD-10-CM

## 2015-07-07 DIAGNOSIS — M5382 Other specified dorsopathies, cervical region: Secondary | ICD-10-CM

## 2015-07-07 DIAGNOSIS — M542 Cervicalgia: Secondary | ICD-10-CM | POA: Diagnosis not present

## 2015-07-07 DIAGNOSIS — M62838 Other muscle spasm: Secondary | ICD-10-CM

## 2015-07-07 NOTE — Therapy (Signed)
[place], [place], [address] Phone: [phone]   Fax:  [phone]  Physical Therapy Treatment  Patient Details  Name: [name] MRN: [id number] Date of Birth: 11/21/1960 Referring Provider: Murvin Donning MD  Encounter Date: 07/07/2015      PT End of Session - 07/07/15 1338    Visit Number 5   Number of Visits 12   Date for PT Re-Evaluation 08/04/15   PT Start Time 0135   PT Stop Time 0218   PT Time Calculation (min) 43 min      Past Medical History  Diagnosis Date  . NSVD (normal spontaneous vaginal delivery)   . HSV-2 (herpes simplex virus 2) infection   . Beta thalassemia minor     MICROCYTIC ANEMIA  . Paresthesia 06/25/2015    Past Surgical History  Procedure Laterality Date  . Cesarean section  1989    FTP  . Resectoscopic polypectomy  2010  . Colonoscopy w/ polypectomy  2013    6  POLYPS ALL BENIGN  . Hemorrhoid surgery      There were no vitals filed for this visit.  Visit Diagnosis:  Muscle spasms of neck  Decreased ROM of intervertebral discs of cervical spine  Neck pain  Abnormal Romberg test          [facility] PT Assessment - 07/07/15 0001    AROM   Cervical Flexion 50   Cervical - Right Side Bend 30   Cervical - Left Side Bend 40   Cervical - Right Rotation 60   Cervical - Left Rotation 45                     [facility] Adult PT Treatment/Exercise - 07/07/15 0001    Shoulder Exercises: Supine   Horizontal ABduction Strengthening;Both;15 reps;Theraband   Theraband Level (Shoulder Horizontal ABduction) Level 3 (Green)   Shoulder Exercises: Standing   Row Theraband;15 reps   Theraband Level (Shoulder Row) Level 3 (Green)   Other Standing Exercises wall-push ups x 10  counter push ups x 10, cues for elbow position   Other Standing Exercises protract/ retract with forearms on wall x 10   Shoulder Exercises: ROM/Strengthening   UBE (Upper Arm Bike) L1 3  min forward, 3 minutes backward   Cybex Row 3 plate   Cybex Row Limitations x 15 low, x 15 mid   Neck Exercises: Stretches   Upper Trapezius Stretch 2 reps;30 seconds                  PT Short Term Goals - 07/02/15 1410    PT SHORT TERM GOAL #1   Title pt will be I with inital HEP (07/14/2015)   Time 3   Period Weeks   Status Achieved   PT SHORT TERM GOAL #2   Title pt will demonstrate proper posture and lifting mechanics to decrease tightness and neck reinjury (07/14/2015)   Time 3   Period Weeks   Status Achieved           PT Long Term Goals - 07/07/15 1345    PT LONG TERM GOAL #1   Title pt will be I with all HEP as of last visit (08/04/2015)   Time 6   Period Weeks   Status On-going   PT LONG TERM GOAL #2   Title pt will demonstrate decreased spasm in the neck and surrounding musculature to assist with pain relief to </=  2/10  pain (08/04/2015)   Status Achieved   PT LONG TERM GOAL #3   Title pt will improver cervical mobility by >/= 5 degrees in all planes to assist with ADLs and safety during driving (9/31/1216)   Time 6   Period Weeks   Status Partially Met   PT LONG TERM GOAL #4   Title pt will be able to resume her personal gym/ exercise routine with </= 1/10 pain in the shoulder / neck per pt personal goals (08/04/2015)   Time 6   Period Weeks   Status On-going               Plan - 07/07/15 1411    Clinical Impression Statement LTG#2 MET, #3 partially MET. Pt reports overall decrease in pain following dry needling. She demonstrates improved cervical mobility however still has deficits in right sidebending and left rotation. Focused scapular exercises today with no increased pain. Review of upper trap stretch reveals incorrect technique. Cues to perfrom correctly.    PT Next Visit Plan assess dry needling, stretching, thoracic mobility, scapular stabilizers, posture education, modalities PRN, thoracic extensions         Problem List Patient  Active Problem List   Diagnosis Date Noted  . Paresthesia 06/25/2015  . Osteopenia 02/04/2014  . Beta thalassemia minor 11/27/2012  . Menopause 11/16/2011    Dorene Ar, PTA 07/07/2015, 2:39 PM  [facility] [address] [place], [place], [address] Phone: [phone]   Fax:  [phone]  Name: JAYDYNN WOLFORD MRN: [id number] Date of Birth: [birth year]

## 2015-07-09 ENCOUNTER — Ambulatory Visit: Payer: BLUE CROSS/BLUE SHIELD | Attending: Internal Medicine | Admitting: Physical Therapy

## 2015-07-09 DIAGNOSIS — M5382 Other specified dorsopathies, cervical region: Secondary | ICD-10-CM | POA: Diagnosis present

## 2015-07-09 DIAGNOSIS — R29818 Other symptoms and signs involving the nervous system: Secondary | ICD-10-CM | POA: Insufficient documentation

## 2015-07-09 DIAGNOSIS — M542 Cervicalgia: Secondary | ICD-10-CM | POA: Insufficient documentation

## 2015-07-09 DIAGNOSIS — M6248 Contracture of muscle, other site: Secondary | ICD-10-CM | POA: Insufficient documentation

## 2015-07-09 DIAGNOSIS — M62838 Other muscle spasm: Secondary | ICD-10-CM

## 2015-07-09 NOTE — Therapy (Signed)
[place], [place], [address] Phone: [phone]   Fax:  [phone]  Physical Therapy Treatment  Patient Details  Name: [name] MRN: [id number] Date of Birth: [birth year] Referring Provider: Murvin Donning MD  Encounter Date: 07/09/2015      PT End of Session - 07/09/15 1340    Visit Number 6   Number of Visits 12   PT Start Time 0135   PT Stop Time 0215   PT Time Calculation (min) 40 min      Past Medical History  Diagnosis Date  . NSVD (normal spontaneous vaginal delivery)   . HSV-2 (herpes simplex virus 2) infection   . Beta thalassemia minor     MICROCYTIC ANEMIA  . Paresthesia 06/25/2015    Past Surgical History  Procedure Laterality Date  . Cesarean section  1989    FTP  . Resectoscopic polypectomy  2010  . Colonoscopy w/ polypectomy  2013    6  POLYPS ALL BENIGN  . Hemorrhoid surgery      There were no vitals filed for this visit.  Visit Diagnosis:  Muscle spasms of neck  Decreased ROM of intervertebral discs of cervical spine  Neck pain  Abnormal Romberg test      Subjective Assessment - 07/09/15 1351    Subjective No pain                         [facility] Adult PT Treatment/Exercise - 07/09/15 0001    Shoulder Exercises: Supine   Other Supine Exercises supine scap stab with yellow band on 1/2 foam roller horiz abdct, ER, and diagonals x 15 each with neutral spine   Shoulder Exercises: ROM/Strengthening   UBE (Upper Arm Bike) L1.5 3 min forward, 3 minutes backward   Shoulder Exercises: Stretch   Other Shoulder Stretches Pectoralis/ER stretch on 1/2 foam roller at 90/90, chest opening perpendicular over 1/2 foam roller, head supported and shoulder protraction and pullover hold, sidelying  book openings for throracic rotation x 3 each side 30 second holds                  PT Short Term Goals - 07/02/15 1410    PT SHORT TERM GOAL #1   Title pt will  be I with inital HEP (07/14/2015)   Time 3   Period Weeks   Status Achieved   PT SHORT TERM GOAL #2   Title pt will demonstrate proper posture and lifting mechanics to decrease tightness and neck reinjury (07/14/2015)   Time 3   Period Weeks   Status Achieved           PT Long Term Goals - 07/07/15 1345    PT LONG TERM GOAL #1   Title pt will be I with all HEP as of last visit (08/04/2015)   Time 6   Period Weeks   Status On-going   PT LONG TERM GOAL #2   Title pt will demonstrate decreased spasm in the neck and surrounding musculature to assist with pain relief to </=  2/10 pain (08/04/2015)   Status Achieved   PT LONG TERM GOAL #3   Title pt will improver cervical mobility by >/= 5 degrees in all planes to assist with ADLs and safety during driving (6/96/2952)   Time 6   Period Weeks   Status Partially Met   PT LONG TERM GOAL #4   Title pt will be able to  resume her personal gym/ exercise routine with </= 1/10 pain in the shoulder / neck per pt personal goals (08/04/2015)   Time 6   Period Weeks   Status On-going               Plan - 07/09/15 1409    Clinical Impression Statement Focused today on thoracic mobility. Instructed in thoracic extension/ pectoralis stretching and thoracic rotation stretches, all modified for comfort throughout. Pt reports she feels a good stretch with each. Added to HEP.    PT Next Visit Plan assess dry needling, stretching, thoracic mobility, scapular stabilizers, posture education, modalities PRN, thoracic extensions -check new thoracic stretches        Problem List Patient Active Problem List   Diagnosis Date Noted  . Paresthesia 06/25/2015  . Osteopenia 02/04/2014  . Beta thalassemia minor 11/27/2012  . Menopause 11/16/2011    Dorene Ar, PTA 07/09/2015, 2:21 PM  [facility] [address] [place], [place], [address] Phone: [phone]   Fax:  [phone]  Name:  [name] MRN: [id number] Date of Birth: [birth year]

## 2015-07-14 ENCOUNTER — Ambulatory Visit: Payer: BLUE CROSS/BLUE SHIELD | Admitting: Physical Therapy

## 2015-07-14 DIAGNOSIS — M62838 Other muscle spasm: Secondary | ICD-10-CM

## 2015-07-14 DIAGNOSIS — M542 Cervicalgia: Secondary | ICD-10-CM

## 2015-07-14 DIAGNOSIS — M5382 Other specified dorsopathies, cervical region: Secondary | ICD-10-CM

## 2015-07-14 DIAGNOSIS — M6248 Contracture of muscle, other site: Secondary | ICD-10-CM | POA: Diagnosis not present

## 2015-07-14 NOTE — Therapy (Signed)
[facility] [address] [place], [place], [address] Phone: [phone]   Fax:  [phone]  Physical Therapy Treatment  Patient Details  Name: [name] MRN: [id number] Date of Birth: 04/08/1961 Referring Provider: Salome Spotted MD  Encounter Date: 07/14/2015      PT End of Session - 07/14/15 1457    Visit Number 7   Number of Visits 12   Date for PT Re-Evaluation 08/04/15   PT Start Time 1415   PT Stop Time 1501   PT Time Calculation (min) 46 min   Activity Tolerance Patient tolerated treatment well   Behavior During Therapy [facility] for tasks assessed/performed      Past Medical History  Diagnosis Date  . NSVD (normal spontaneous vaginal delivery)   . HSV-2 (herpes simplex virus 2) infection   . Beta thalassemia minor     MICROCYTIC ANEMIA  . Paresthesia 06/25/2015    Past Surgical History  Procedure Laterality Date  . Cesarean section  1989    FTP  . Resectoscopic polypectomy  2010  . Colonoscopy w/ polypectomy  2013    6  POLYPS ALL BENIGN  . Hemorrhoid surgery      There were no vitals filed for this visit.  Visit Diagnosis:  Muscle spasms of neck  Decreased ROM of intervertebral discs of cervical spine  Neck pain      Subjective Assessment - 07/14/15 1419    Subjective " I am feeling good, the needling has really help"   Currently in Pain? No/denies                         [facility] Adult PT Treatment/Exercise - 07/14/15 1422    Shoulder Exercises: Supine   Other Supine Exercises supine scapulare stabilizers on 1/2 foam roll: horizontal  2 x 10 with red theraband, D2 1 x 10 bil. back stroke 2 x 10, bil shoulder protraction x 15, alternating protraction x 15    Other Supine Exercises thoracic extension over perpendicular bolster 2 x 10  with hands behind head and elbows adducted   Shoulder Exercises: Prone   Other Prone Exercises I's, T's, Y's 1# x 15 each direction   Shoulder Exercises:  ROM/Strengthening   UBE (Upper Arm Bike) L 1.5 x 8 min  changing direction at 4 min   Manual Therapy   Manual therapy comments manuall trigger point release of L upper trap x 2   educated how to do at home with theracane or tennis ball   Neck Exercises: Stretches   Upper Trapezius Stretch 2 reps;30 seconds   Levator Stretch 2 reps;30 seconds                PT Education - 07/14/15 1752    Education provided Yes   Education Details updated HEP   Person(s) Educated Patient   Methods Explanation   Comprehension Verbalized understanding          PT Short Term Goals - 07/02/15 1410    PT SHORT TERM GOAL #1   Title pt will be I with inital HEP (07/14/2015)   Time 3   Period Weeks   Status Achieved   PT SHORT TERM GOAL #2   Title pt will demonstrate proper posture and lifting mechanics to decrease tightness and neck reinjury (07/14/2015)   Time 3   Period Weeks   Status Achieved           PT Long Term  Goals - 07/14/15 1754    PT LONG TERM GOAL #1   Title pt will be I with all HEP as of last visit (08/04/2015)   Time 6   Period Weeks   Status On-going   PT LONG TERM GOAL #2   Title pt will demonstrate decreased spasm in the neck and surrounding musculature to assist with pain relief to </=  2/10 pain (08/04/2015)   Time 6   Period Weeks   Status Achieved   PT LONG TERM GOAL #3   Title pt will improver cervical mobility by >/= 5 degrees in all planes to assist with ADLs and safety during driving (6/73/4193)   Time 6   Period Weeks   Status Partially Met   PT LONG TERM GOAL #4   Title pt will be able to resume her personal gym/ exercise routine with </= 1/10 pain in the shoulder / neck per pt personal goals (08/04/2015)   Time 6   Period Weeks   Status On-going               Plan - 07/14/15 1458    Clinical Impression Statement Tessy reports that she is doing better. focused on throacic mobility and scapular stablizers with 1/2 foam roll. added I's, T's and  Y's to her HEP for scapular stabilizer exercise. following todays session educated how she can work on trigger points with theracane and where she can find one online.   PT Next Visit Plan stretching, thoracic mobility, scapular stabilizers, thoracic extensions, dry needling PRN,    Consulted and Agree with Plan of Care Patient        Problem List Patient Active Problem List   Diagnosis Date Noted  . Paresthesia 06/25/2015  . Osteopenia 02/04/2014  . Beta thalassemia minor 11/27/2012  . Menopause 11/16/2011   [name] PT, DPT, LAT, ATC  07/14/2015  5:56 PM     [facility] [address] [place], [place], [address] Phone: [phone]   Fax:  [phone]  Name: KEAUNA BRASEL MRN: [id number] Date of Birth: [birth year]

## 2015-07-14 NOTE — Patient Instructions (Signed)
   2 x 15 each direction with 2 lbs.

## 2015-07-16 ENCOUNTER — Ambulatory Visit: Payer: BLUE CROSS/BLUE SHIELD | Admitting: Physical Therapy

## 2015-07-16 DIAGNOSIS — R29818 Other symptoms and signs involving the nervous system: Secondary | ICD-10-CM

## 2015-07-16 DIAGNOSIS — M5382 Other specified dorsopathies, cervical region: Secondary | ICD-10-CM

## 2015-07-16 DIAGNOSIS — M542 Cervicalgia: Secondary | ICD-10-CM

## 2015-07-16 DIAGNOSIS — M62838 Other muscle spasm: Secondary | ICD-10-CM

## 2015-07-16 DIAGNOSIS — M6248 Contracture of muscle, other site: Secondary | ICD-10-CM | POA: Diagnosis not present

## 2015-07-16 NOTE — Therapy (Signed)
[place], [place], [address] Phone: [phone]   Fax:  [phone]  Physical Therapy Treatment  Patient Details  Name: [name] MRN: [id number] Date of Birth: 1961-03-03 Referring Provider: Murvin Donning MD  Encounter Date: 07/16/2015      PT End of Session - 07/16/15 1501    Visit Number 8   Number of Visits 12   Date for PT Re-Evaluation 08/04/15   PT Start Time 0216   PT Stop Time 0315   PT Time Calculation (min) 59 min      Past Medical History  Diagnosis Date  . NSVD (normal spontaneous vaginal delivery)   . HSV-2 (herpes simplex virus 2) infection   . Beta thalassemia minor     MICROCYTIC ANEMIA  . Paresthesia 06/25/2015    Past Surgical History  Procedure Laterality Date  . Cesarean section  1989    FTP  . Resectoscopic polypectomy  2010  . Colonoscopy w/ polypectomy  2013    6  POLYPS ALL BENIGN  . Hemorrhoid surgery      There were no vitals filed for this visit.  Visit Diagnosis:  Muscle spasms of neck  Decreased ROM of intervertebral discs of cervical spine  Neck pain  Abnormal Romberg test      Subjective Assessment - 07/16/15 1426    Subjective I had a restless night. I ordered a natural oil that is a pain patch. I will try that today. I still feel a lump there. It is not as tight. I also ordered 2 foam rollers and the theracane.    Currently in Pain? No/denies   Aggravating Factors  sleep positions   Pain Relieving Factors exercises, stretches            [facility] PT Assessment - 07/16/15 0001    AROM   Cervical - Right Side Bend 40   Cervical - Left Side Bend 40   Cervical - Right Rotation 52   Cervical - Left Rotation 52                     [facility] Adult PT Treatment/Exercise - 07/16/15 0001    Shoulder Exercises: ROM/Strengthening   UBE (Upper Arm Bike) L 2 x 8 min  changing direction at 4 min   Moist Heat Therapy   Number Minutes Moist Heat  15 Minutes   Moist Heat Location Cervical   Ultrasound   Ultrasound Location Left upper trap   Ultrasound Parameters 31mz 100% 1.4 w/cm2 x 8 min   Ultrasound Goals Pain   Manual Therapy   Soft tissue mobilization IASTM L upper trap and levator scapuale  left with active release   Neck Exercises: Stretches   Upper Trapezius Stretch 3 reps;30 seconds   Levator Stretch 3 reps;30 seconds                  PT Short Term Goals - 07/02/15 1410    PT SHORT TERM GOAL #1   Title pt will be I with inital HEP (07/14/2015)   Time 3   Period Weeks   Status Achieved   PT SHORT TERM GOAL #2   Title pt will demonstrate proper posture and lifting mechanics to decrease tightness and neck reinjury (07/14/2015)   Time 3   Period Weeks   Status Achieved           PT Long Term Goals - 07/16/15 1507    PT LONG  TERM GOAL #1   Title pt will be I with all HEP as of last visit (08/04/2015)   Status On-going   PT LONG TERM GOAL #2   Title pt will demonstrate decreased spasm in the neck and surrounding musculature to assist with pain relief to </=  2/10 pain (08/04/2015)   Status Achieved   PT LONG TERM GOAL #3   Title pt will improver cervical mobility by >/= 5 degrees in all planes to assist with ADLs and safety during driving (9/72/8206)   Status Achieved   PT LONG TERM GOAL #4   Title pt will be able to resume her personal gym/ exercise routine with </= 1/10 pain in the shoulder / neck per pt personal goals (08/04/2015)   Status On-going               Plan - 07/16/15 1504    Clinical Impression Statement Pt has purchased foam rollers and self trigger point release theracane. She reports continued difficulty with sleep positions and is unable to lay on left side due to pain. She still feels a large knot in left upper trap. Used heated ultrasound followed by IASTM to left upper trap and levator using active release. Followed this with upper trap and levator stretches. Encouraged pt to  try heat prior to stretching at home. Her cervical AROM has improved for both sidebending and rotation. LTG#3 MET.    PT Next Visit Plan stretching, thoracic mobility, scapular stabilizers, thoracic extensions, dry needling PRN, Review I,T,Y manaul if helpful        Problem List Patient Active Problem List   Diagnosis Date Noted  . Paresthesia 06/25/2015  . Osteopenia 02/04/2014  . Beta thalassemia minor 11/27/2012  . Menopause 11/16/2011    Dorene Ar, PTA 07/16/2015, 3:08 PM  [facility] [address] [place], [place], [address] Phone: [phone]   Fax:  [phone]  Name: [name] MRN: [id number] Date of Birth: 08-May-1961

## 2015-07-21 ENCOUNTER — Ambulatory Visit: Payer: BLUE CROSS/BLUE SHIELD | Admitting: Physical Therapy

## 2015-07-21 DIAGNOSIS — M542 Cervicalgia: Secondary | ICD-10-CM

## 2015-07-21 DIAGNOSIS — R29818 Other symptoms and signs involving the nervous system: Secondary | ICD-10-CM

## 2015-07-21 DIAGNOSIS — M62838 Other muscle spasm: Secondary | ICD-10-CM

## 2015-07-21 DIAGNOSIS — M6248 Contracture of muscle, other site: Secondary | ICD-10-CM | POA: Diagnosis not present

## 2015-07-21 DIAGNOSIS — M5382 Other specified dorsopathies, cervical region: Secondary | ICD-10-CM

## 2015-07-21 NOTE — Patient Instructions (Addendum)

## 2015-07-21 NOTE — Therapy (Signed)
[place], [place], [address] Phone: [phone]   Fax:  [phone]  Physical Therapy Treatment  Patient Details  Name: [name] MRN: [id number] Date of Birth: July 13, 1960 Referring Provider: Murvin Donning MD  Encounter Date: 07/21/2015      PT End of Session - 07/21/15 1520    Visit Number 9   Number of Visits 12   Date for PT Re-Evaluation 08/04/15   PT Start Time [id number]   PT Stop Time 1515   PT Time Calculation (min) 53 min   Activity Tolerance Patient tolerated treatment well   Behavior During Therapy [facility] for tasks assessed/performed      Past Medical History  Diagnosis Date  . NSVD (normal spontaneous vaginal delivery)   . HSV-2 (herpes simplex virus 2) infection   . Beta thalassemia minor     MICROCYTIC ANEMIA  . Paresthesia 06/25/2015    Past Surgical History  Procedure Laterality Date  . Cesarean section  1989    FTP  . Resectoscopic polypectomy  2010  . Colonoscopy w/ polypectomy  2013    6  POLYPS ALL BENIGN  . Hemorrhoid surgery      There were no vitals filed for this visit.  Visit Diagnosis:  Muscle spasms of neck  Decreased ROM of intervertebral discs of cervical spine  Neck pain  Abnormal Romberg test      Subjective Assessment - 07/21/15 1425    Subjective Not back to the gym.  Feels tense all the time.  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  She has tried all kinds of pillows and nothing helps for long.  Not yet back to the gym.     Currently in Pain? Yes   Pain Score 4    Pain Location Neck   Pain Orientation Left   Pain Descriptors / Indicators Sore   Pain Radiating Towards upper traps   Pain Frequency Constant   Aggravating Factors  sleep positions.  All ADL   Pain Relieving Factors stretches,  exercises,  patch,  massage it out.                          [facility] Adult PT  Treatment/Exercise - 07/21/15 1436    Self-Care   Self-Care ADL's   ADL's handout partially reviewed,  practiced sitting with lumbar support and sleeping with rolled towel for neck support.     Shoulder Exercises: Supine   Other Supine Exercises neck stabilization 2 on pink foam roller, 10 X each with 2 LBS barbells, all in series. No weight for "Dead bug"   Shoulder Exercises: ROM/Strengthening   UBE (Upper Arm Bike) 1.5 3 each way   Shoulder Exercises: Stretch   Corner Stretch 3 reps;30 seconds  doorway cues   Manual Therapy   Other Manual Therapy taping to inhibit upper traps and for posture correction both sides.     Neck Exercises: Stretches   Upper Trapezius Stretch 3 reps;30 seconds   Lower Cervical/Upper Thoracic Stretch 3 reps;30 seconds   foam roller horizontal distal scapulaelbows flexed adducted                PT Education - 07/21/15 1520    Education provided Yes   Education Details ADL's   Person(s) Educated Patient   Methods Explanation;Demonstration;Verbal cues;Handout   Comprehension Verbalized understanding;Returned demonstration  PT Short Term Goals - 07/02/15 1410    PT SHORT TERM GOAL #1   Title pt will be I with inital HEP (07/14/2015)   Time 3   Period Weeks   Status Achieved   PT SHORT TERM GOAL #2   Title pt will demonstrate proper posture and lifting mechanics to decrease tightness and neck reinjury (07/14/2015)   Time 3   Period Weeks   Status Achieved           PT Long Term Goals - 07/21/15 1527    PT LONG TERM GOAL #1   Title pt will be I with all HEP as of last visit (08/04/2015)   Time 6   Status On-going   PT LONG TERM GOAL #2   Title pt will demonstrate decreased spasm in the neck and surrounding musculature to assist with pain relief to </=  2/10 pain (08/04/2015)   Time 6   Period Weeks   Status Achieved   PT LONG TERM GOAL #3   Title pt will improver cervical mobility by >/= 5 degrees in all planes to assist with  ADLs and safety during driving ([id number])   Time 6   Period Weeks   Status Achieved   PT LONG TERM GOAL #4   Baseline Not yet back to gym   Time 6   Period Weeks   Status On-going               Plan - 07/21/15 1521    Clinical Impression Statement Continued stabilization exercises.  Patient was able to progress to round foam roller exercises today.  She was interested in learning more about ADL techniques.  Neck ROM continues to be limited.     PT Next Visit Plan stretching, thoracic mobility, scapular stabilizers, thoracic extensions, dry needling PRN, Review I,T,Y manaul if helpful,  assess taping.  answer aby ADL questions.   10 th visit next,,  FOTO ?   PT Home Exercise Plan continue   Consulted and Agree with Plan of Care Patient        Problem List Patient Active Problem List   Diagnosis Date Noted  . Paresthesia 06/25/2015  . Osteopenia 02/04/2014  . Beta thalassemia minor 11/27/2012  . Menopause 11/16/2011    HARRIS,KAREN 07/21/2015, 3:29 PM  [facility] [address] [place], [place], [address] Phone: [phone]   Fax:  [phone]  Name: [name] MRN: [id number] Date of Birth: 01-11-1961    Melvenia Needles, PTA 07/21/2015 3:29 PM Phone: [phone] Fax: [phone]

## 2015-07-23 ENCOUNTER — Ambulatory Visit: Payer: BLUE CROSS/BLUE SHIELD | Admitting: Physical Therapy

## 2015-07-23 DIAGNOSIS — M6248 Contracture of muscle, other site: Secondary | ICD-10-CM | POA: Diagnosis not present

## 2015-07-23 DIAGNOSIS — M5382 Other specified dorsopathies, cervical region: Secondary | ICD-10-CM

## 2015-07-23 DIAGNOSIS — M542 Cervicalgia: Secondary | ICD-10-CM

## 2015-07-23 DIAGNOSIS — M62838 Other muscle spasm: Secondary | ICD-10-CM

## 2015-07-23 NOTE — Therapy (Signed)
[place], [place], [address] Phone: [phone]   Fax:  [phone]  Physical Therapy Treatment  Patient Details  Name: [name] MRN: [id number] Date of Birth: 1961-02-02 Referring Provider: Murvin Donning MD  Encounter Date: 07/23/2015      PT End of Session - 07/23/15 1425    Visit Number 10   Number of Visits 12   Date for PT Re-Evaluation 08/04/15   PT Start Time [id number]   PT Stop Time 1510   PT Time Calculation (min) 50 min   Activity Tolerance Patient tolerated treatment well   Behavior During Therapy [facility] for tasks assessed/performed      Past Medical History  Diagnosis Date  . NSVD (normal spontaneous vaginal delivery)   . HSV-2 (herpes simplex virus 2) infection   . Beta thalassemia minor     MICROCYTIC ANEMIA  . Paresthesia 06/25/2015    Past Surgical History  Procedure Laterality Date  . Cesarean section  1989    FTP  . Resectoscopic polypectomy  2010  . Colonoscopy w/ polypectomy  2013    6  POLYPS ALL BENIGN  . Hemorrhoid surgery      There were no vitals filed for this visit.  Visit Diagnosis:  Muscle spasms of neck  Decreased ROM of intervertebral discs of cervical spine  Neck pain      Subjective Assessment - 07/23/15 1424    Subjective "I am feeling ok, I did heat and been using the theracane"    Currently in Pain? No/denies            [facility] PT Assessment - 07/23/15 1429    Observation/Other Assessments   Focus on Therapeutic Outcomes (FOTO)  2% limited   Palpation   Palpation comment upper trap tightness                      [facility] Adult PT Treatment/Exercise - 07/23/15 0001    Moist Heat Therapy   Number Minutes Moist Heat 8 Minutes   Moist Heat Location Cervical   Manual Therapy   Joint Mobilization thoracic grade 3 P>A T1-T7 joint mobs   Soft tissue mobilization IASTM L upper trap and levator scapuale   Myofascial Release rolling and  stretching over the L upper trap and levator scapulae   Other Manual Therapy L upper trap inhibition taping tapin trial          Trigger Point Dry Needling - 07/23/15 1502    Consent Given? Yes   Education Handout Provided Yes  given previously   Upper Trapezius Response Twitch reponse elicited;Palpable increased muscle length   Levator Scapulae Response Twitch response elicited;Palpable increased muscle length              PT Education - 07/23/15 1454    Education provided Yes   Education Details upper trap inhibitoin tapin   Person(s) Educated Patient   Methods Explanation   Comprehension Verbalized understanding          PT Short Term Goals - 07/02/15 1410    PT SHORT TERM GOAL #1   Title pt will be I with inital HEP (07/14/2015)   Time 3   Period Weeks   Status Achieved   PT SHORT TERM GOAL #2   Title pt will demonstrate proper posture and lifting mechanics to decrease tightness and neck reinjury (07/14/2015)   Time 3   Period Weeks   Status Achieved  PT Long Term Goals - 07/21/15 1527    PT LONG TERM GOAL #1   Title pt will be I with all HEP as of last visit (08/04/2015)   Time 6   Status On-going   PT LONG TERM GOAL #2   Title pt will demonstrate decreased spasm in the neck and surrounding musculature to assist with pain relief to </=  2/10 pain (08/04/2015)   Time 6   Period Weeks   Status Achieved   PT LONG TERM GOAL #3   Title pt will improver cervical mobility by >/= 5 degrees in all planes to assist with ADLs and safety during driving ([id number])   Time 6   Period Weeks   Status Achieved   PT LONG TERM GOAL #4   Baseline Not yet back to gym   Time 6   Period Weeks   Status On-going               Plan - 07/23/15 1450    Clinical Impression Statement S[place]enah continues to make good progress with physical therapy despite no changes in her FOTO score. Peroformed TPDN to decreasee spasm of the L upper trap / levator scap where  multiple twtiches were palapable and relief was noted following IASTM and trial of upper trap inhibition taping. utlized MHP following todays session to faciliate decreased soreness following todays session.    PT Next Visit Plan assess response ot upper trap inhibition taping, stretching, thoracic mobility, scapular stabilizers, thoracic extensions, dry needling PRN, Review I,T,Y manaul if helpful,    PT Home Exercise Plan reviewed HEP        Problem List Patient Active Problem List   Diagnosis Date Noted  . Paresthesia 06/25/2015  . Osteopenia 02/04/2014  . Beta thalassemia minor 11/27/2012  . Menopause 11/16/2011   [name] PT, DPT, LAT, ATC  07/23/2015  3:03 PM     [place] [facility] [address] [place], [place], [address] Phone: [phone]   Fax:  [phone]  Name: [name] MRN: [id number] Date of Birth: February 28, 1961

## 2015-07-28 ENCOUNTER — Telehealth: Payer: Self-pay | Admitting: Neurology

## 2015-07-28 NOTE — Telephone Encounter (Signed)
I called the patient. The carotid doppler study was OK.

## 2015-07-29 NOTE — Telephone Encounter (Signed)
I called patient. I discussed the results of the MRI, carotid Doppler study, and blood work. The workup has not shown any serious medical or neurologic illness with the patient. She is having less frequent episodes of facial numbness. She is on low-dose aspirin. We will follow things conservatively at this time, if her condition changes, she is to contact our office.

## 2015-07-29 NOTE — Telephone Encounter (Signed)
Pt returned Dr [name] call. Message relayed doppler was ok. The patient asked what she is to do now? Please call at [phone].

## 2015-08-04 ENCOUNTER — Ambulatory Visit: Payer: BLUE CROSS/BLUE SHIELD | Admitting: Physical Therapy

## 2015-08-04 DIAGNOSIS — M6248 Contracture of muscle, other site: Secondary | ICD-10-CM | POA: Diagnosis not present

## 2015-08-04 DIAGNOSIS — M542 Cervicalgia: Secondary | ICD-10-CM

## 2015-08-04 DIAGNOSIS — R29818 Other symptoms and signs involving the nervous system: Secondary | ICD-10-CM

## 2015-08-04 DIAGNOSIS — M62838 Other muscle spasm: Secondary | ICD-10-CM

## 2015-08-04 DIAGNOSIS — M5382 Other specified dorsopathies, cervical region: Secondary | ICD-10-CM

## 2015-08-04 NOTE — Therapy (Signed)
[place], [place], [address] Phone: [phone]   Fax:  [phone]  Physical Therapy Treatment  Patient Details  Name: [name] MRN: [id number] Date of Birth: 1961/04/21 Referring Provider: Murvin Donning MD  Encounter Date: 08/04/2015      PT End of Session - 08/04/15 1427    Visit Number 11   Number of Visits 12   Date for PT Re-Evaluation 08/04/15   PT Start Time 0217   PT Stop Time 0302   PT Time Calculation (min) 45 min      Past Medical History  Diagnosis Date  . NSVD (normal spontaneous vaginal delivery)   . HSV-2 (herpes simplex virus 2) infection   . Beta thalassemia minor     MICROCYTIC ANEMIA  . Paresthesia 06/25/2015    Past Surgical History  Procedure Laterality Date  . Cesarean section  1989    FTP  . Resectoscopic polypectomy  2010  . Colonoscopy w/ polypectomy  2013    6  POLYPS ALL BENIGN  . Hemorrhoid surgery      There were no vitals filed for this visit.  Visit Diagnosis:  Muscle spasms of neck  Decreased ROM of intervertebral discs of cervical spine  Neck pain  Abnormal Romberg test      Subjective Assessment - 08/04/15 1648    Subjective My left arm has been going numb over the last week. My carotid doppler and other tests were all negative. The Neurologist did not find anything.     Pain Score 4   or 5 also left arm numbness   Pain Location Neck   Pain Frequency Intermittent   Aggravating Factors  sleep positions   Pain Relieving Factors stretches, massage    Door way stretch 3 x 30 sec                     [facility] Adult PT Treatment/Exercise - 08/04/15 0001    Shoulder Exercises: Supine   Other Supine Exercises neck stabilization 2 on pink foam roller, 10 X each with 2 LBS barbells,except dead bug   Shoulder Exercises: Prone   Other Prone Exercises Quadruped over PB alternating UE lift with chin tuck then alt UE/LE with chin tuck x10    Other Prone Exercises I's, T's,   x 15 each direction prone over ball   Shoulder Exercises: ROM/Strengthening   UBE (Upper Arm Bike) L 2 x 8 min  changing direction at 4 min                  PT Short Term Goals - 07/02/15 1410    PT SHORT TERM GOAL #1   Title pt will be I with inital HEP (07/14/2015)   Time 3   Period Weeks   Status Achieved   PT SHORT TERM GOAL #2   Title pt will demonstrate proper posture and lifting mechanics to decrease tightness and neck reinjury (07/14/2015)   Time 3   Period Weeks   Status Achieved           PT Long Term Goals - 07/21/15 1527    PT LONG TERM GOAL #1   Title pt will be I with all HEP as of last visit (08/04/2015)   Time 6   Status On-going   PT LONG TERM GOAL #2   Title pt will demonstrate decreased spasm in the neck and surrounding musculature to assist with pain relief to </=  2/10 pain (08/04/2015)   Time 6   Period Weeks   Status Achieved   PT LONG TERM GOAL #3   Title pt will improver cervical mobility by >/= 5 degrees in all planes to assist with ADLs and safety during driving (0/79/3109)   Time 6   Period Weeks   Status Achieved   PT LONG TERM GOAL #4   Baseline Not yet back to gym   Time 6   Period Weeks   Status On-going               Plan - 08/04/15 1649    Clinical Impression Statement Inhibition tape helped for 1.5 days and then began to itch so removed. Pt reports her pain has improved with PT and she would like to continue. She c/ o increased left arm numbness this week. Instructed pt in postural stretches as well as cervical and scapular stabilization exercises with pt reporting no left arm numbness at end of session. She will use heat at home. She has met all stg goals and  LTG#2,3. She has returned to the gym for only Treadmill and stretching exercises. Will schedule her for a re-eval with her primary PT.    PT Next Visit Plan assess response ot upper trap inhibition taping, stretching, thoracic  mobility, scapular stabilizers, thoracic extensions, dry needling PRN, Review I,T,Y manaul if helpful,         Problem List Patient Active Problem List   Diagnosis Date Noted  . Paresthesia 06/25/2015  . Osteopenia 02/04/2014  . Beta thalassemia minor 11/27/2012  . Menopause 11/16/2011    Dorene Ar, PTA 08/04/2015, 4:54 PM  [place], [place], [address] Phone: [phone]   Fax:  [phone]  Name: [name] MRN: [id number] Date of Birth: 05-12-1960

## 2015-08-11 ENCOUNTER — Ambulatory Visit: Payer: BLUE CROSS/BLUE SHIELD | Attending: Internal Medicine | Admitting: Physical Therapy

## 2015-08-11 DIAGNOSIS — M542 Cervicalgia: Secondary | ICD-10-CM | POA: Insufficient documentation

## 2015-08-11 DIAGNOSIS — R252 Cramp and spasm: Secondary | ICD-10-CM | POA: Insufficient documentation

## 2015-08-11 NOTE — Patient Instructions (Signed)

## 2015-08-11 NOTE — Therapy (Signed)
[facility] [address] [place], [place], [address] Phone: [phone]   Fax:  [phone]  Physical Therapy Treatment / discharge note  Patient Details  Name: [name] MRN: [id number] Date of Birth: 1960-06-21 Referring Provider: Salome Spotted MD  Encounter Date: 08/11/2015      PT End of Session - 08/11/15 1457    Visit Number 12   Number of Visits 12   Date for PT Re-Evaluation 08/12/15   PT Start Time 1415   PT Stop Time 1455   PT Time Calculation (min) 40 min   Activity Tolerance Patient tolerated treatment well   Behavior During Therapy [facility] for tasks assessed/performed      Past Medical History  Diagnosis Date  . NSVD (normal spontaneous vaginal delivery)   . HSV-2 (herpes simplex virus 2) infection   . Beta thalassemia minor     MICROCYTIC ANEMIA  . Paresthesia 06/25/2015    Past Surgical History  Procedure Laterality Date  . Cesarean section  1989    FTP  . Resectoscopic polypectomy  2010  . Colonoscopy w/ polypectomy  2013    6  POLYPS ALL BENIGN  . Hemorrhoid surgery      There were no vitals filed for this visit.  Visit Diagnosis:  Cramp and spasm - Plan: PT plan of care cert/re-cert  Cervicalgia - Plan: PT plan of care cert/re-cert      Subjective Assessment - 08/11/15 1420    Subjective "so far everything is going well, I still have some tightness in the shoulder and have been working on getting it to relax" pt reports still having some numbness that seems to be more requent in the L arm since the last session that goes into the l hand.    Currently in Pain? Yes   Pain Score 2    Pain Location Neck   Pain Orientation Left   Pain Descriptors / Indicators Pins and needles;Tingling;Sore   Pain Radiating Towards L    Aggravating Factors  unknown   Pain Relieving Factors stretches, message            [facility] PT Assessment - 08/11/15 1433    AROM   Cervical Flexion 55   Cervical Extension  52   Cervical - Right Side Bend 38   Cervical - Left Side Bend 40   Cervical - Right Rotation 45   Cervical - Left Rotation 40   Palpation   Palpation comment L upper trap and levator scap spasm/ tightness                     [facility] Adult PT Treatment/Exercise - 08/11/15 1426    Self-Care   Posture posture education with spinal postions and lifting mechanics handout.    Shoulder Exercises: Seated   Other Seated Exercises cervical chin tucks 1 x 10 with 5 sec hold   Shoulder Exercises: ROM/Strengthening   UBE (Upper Arm Bike) L2.5 x 8 min  changing direction at 4 min   Neck Exercises: Stretches   Upper Trapezius Stretch 30 seconds;1 rep   Levator Stretch 1 rep;30 seconds                PT Education - 08/11/15 1457    Education provided Yes   Education Details HEP review and Posture education   Person(s) Educated Patient   Methods Explanation;Handout   Comprehension Verbalized understanding          PT Short Term  Goals - 07/02/15 1410    PT SHORT TERM GOAL #1   Title pt will be I with inital HEP (07/14/2015)   Time 3   Period Weeks   Status Achieved   PT SHORT TERM GOAL #2   Title pt will demonstrate proper posture and lifting mechanics to decrease tightness and neck reinjury (07/14/2015)   Time 3   Period Weeks   Status Achieved           PT Long Term Goals - 08/11/15 1438    PT LONG TERM GOAL #1   Title pt will be I with all HEP as of last visit (08/04/2015)   Time 6   Period Weeks   Status Achieved   PT LONG TERM GOAL #2   Title pt will demonstrate decreased spasm in the neck and surrounding musculature to assist with pain relief to </=  2/10 pain (08/04/2015)   Time 6   Period Weeks   Status Achieved   PT LONG TERM GOAL #3   Title pt will improver cervical mobility by >/= 5 degrees in all planes to assist with ADLs and safety during driving (9/32/6712)   Time 6   Period Weeks   Status Achieved   PT LONG TERM GOAL #4   Title pt will  be able to resume her personal gym/ exercise routine with </= 1/10 pain in the shoulder / neck per pt personal goals (08/04/2015)   Time 6   Period Weeks   Status Achieved               Plan - 08/11/15 1429    Clinical Impression Statement [name] reports she has been consistent with her HEP but has had some tingling in the L arm. upon reassessment of her posture she demonstrates incresaed forward head positioninig which after working on pulling shoulders back and chin tucks she reported no tingling in the shoulder/ arm. She has improved cervical mobility since her evaluation. She met all goals. She is able to maintain and progress her current level of funciton independently and will be discharged today.    Pt will benefit from skilled therapeutic intervention in order to improve on the following deficits Pain;Improper body mechanics;Postural dysfunction;Hypomobility;Decreased endurance;Increased muscle spasms;Decreased activity tolerance  Cervicalgia, Cramp and spasm   Rehab Potential Good   PT Treatment/Interventions Passive range of motion;Dry needling;Taping;Iontophoresis '4mg'$ /ml Dexamethasone;Electrical Stimulation;ADLs/Self Care Home Management;Cryotherapy;Moist Heat;Therapeutic exercise;Therapeutic activities;Ultrasound;Patient/family education;Manual techniques   PT Next Visit Plan dicharged    PT Home Exercise Plan HEP review and posture education.    Consulted and Agree with Plan of Care Patient        Problem List Patient Active Problem List   Diagnosis Date Noted  . Paresthesia 06/25/2015  . Osteopenia 02/04/2014  . Beta thalassemia minor 11/27/2012  . Menopause 11/16/2011   [name] PT, DPT, LAT, ATC  08/11/2015  3:05 PM       [facility] [address] [place], [place], [address] Phone: [phone]   Fax:  [phone]  Name: [name] MRN: [id number] Date of Birth: [birth year]    PHYSICAL THERAPY  DISCHARGE SUMMARY  Visits from Start of Care: 12  Current functional level related to goals / functional outcomes: See goals   Remaining deficits: Intermittent tightness of the L upper trap, poor posture with intermittent tingling in the L arm that  that is correctable with cueing with no referral of symptoms with correct posture.    Education /  Equipment: Posture education, HEP, theraband  Plan: Patient agrees to discharge.  Patient goals were met. Patient is being discharged due to meeting the stated rehab goals.  ?????

## 2015-08-18 ENCOUNTER — Encounter: Payer: BLUE CROSS/BLUE SHIELD | Admitting: Physical Therapy

## 2015-08-25 ENCOUNTER — Encounter: Payer: BLUE CROSS/BLUE SHIELD | Admitting: Physical Therapy

## 2015-09-01 ENCOUNTER — Encounter: Payer: BLUE CROSS/BLUE SHIELD | Admitting: Physical Therapy

## 2016-01-15 ENCOUNTER — Other Ambulatory Visit: Payer: Self-pay | Admitting: Otolaryngology

## 2016-02-10 ENCOUNTER — Encounter: Payer: Self-pay | Admitting: Gynecology

## 2016-02-10 ENCOUNTER — Ambulatory Visit (INDEPENDENT_AMBULATORY_CARE_PROVIDER_SITE_OTHER): Payer: BLUE CROSS/BLUE SHIELD | Admitting: Gynecology

## 2016-02-10 VITALS — BP 128/76 | Ht 62.25 in | Wt 137.0 lb

## 2016-02-10 DIAGNOSIS — M8580 Other specified disorders of bone density and structure, unspecified site: Secondary | ICD-10-CM | POA: Diagnosis not present

## 2016-02-10 DIAGNOSIS — N951 Menopausal and female climacteric states: Secondary | ICD-10-CM | POA: Diagnosis not present

## 2016-02-10 DIAGNOSIS — Z01419 Encounter for gynecological examination (general) (routine) without abnormal findings: Secondary | ICD-10-CM | POA: Diagnosis not present

## 2016-02-10 NOTE — Patient Instructions (Signed)
Estradiol vaginal tablets What is this medicine? ESTRADIOL (es tra DYE ole) vaginal tablet is used to help relieve symptoms of vaginal irritation and dryness that occurs in some women during menopause. This medicine may be used for other purposes; ask your health care provider or pharmacist if you have questions. What should I tell my health care provider before I take this medicine? They need to know if you have any of these conditions: -abnormal vaginal bleeding -blood vessel disease or blood clots -breast, cervical, endometrial, ovarian, liver, or uterine cancer -dementia -diabetes -gallbladder disease -heart disease or recent heart attack -high blood pressure -high cholesterol -high level of calcium in the blood -hysterectomy -kidney disease -liver disease -migraine headaches -protein C deficiency -protein S deficiency -stroke -systemic lupus erythematosus (SLE) -tobacco smoker -an unusual or allergic reaction to estrogens, other hormones, medicines, foods, dyes, or preservatives -pregnant or trying to get pregnant -breast-feeding How should I use this medicine? This medicine is only for use in the vagina. Do not take by mouth. Wash and dry your hands before and after use. Read package directions carefully. Unwrap the applicator package. Be sure to use a new applicator for each dose. Use at the same time each day. If the tablet has fallen out of the applicator, but is still in the package, carefully place it back into the applicator. If the tablet has fallen out of the package, that applicator should be thrown out and you should use a new applicator containing a new tablet. Lie on your back, part and bend your knees. Gently insert the applicator as far as comfortably possible into the vagina. Then, gently press the plunger until the plunger is fully depressed. This will release the tablet into the vagina. Gently remove the applicator. Throw away the applicator after use. Do not use  your medicine more often than directed. Do not stop using except on the advice of your doctor or health care professional. Talk to your pediatrician regarding the use of this medicine in children. This medicine is not approved for use in children. A patient package insert for the product will be given with each prescription and refill. Read this sheet carefully each time. The sheet may change frequently. Overdosage: If you think you have taken too much of this medicine contact a poison control center or emergency room at once. NOTE: This medicine is only for you. Do not share this medicine with others. What if I miss a dose? If you miss a dose, take it as soon as you can. If it is almost time for your next dose, take only that dose. Do not take double or extra doses. What may interact with this medicine? Do not take this medicine with any of the following medications: -aromatase inhibitors like aminoglutethimide, anastrozole, exemestane, letrozole, testolactone This medicine may also interact with the following medications: -antibiotics used to treat tuberculosis like rifabutin, rifampin and rifapentene -raloxifene or tamoxifen -warfarin This list may not describe all possible interactions. Give your health care provider a list of all the medicines, herbs, non-prescription drugs, or dietary supplements you use. Also tell them if you smoke, drink alcohol, or use illegal drugs. Some items may interact with your medicine. What should I watch for while using this medicine? Visit your health care professional for regular checks on your progress. You will need a regular breast and pelvic exam. You should also discuss the need for regular mammograms with your health care professional, and follow his or her guidelines. This medicine can make  your body retain fluid, making your fingers, hands, or ankles swell. Your blood pressure can go up. Contact your doctor or health care professional if you feel you are  retaining fluid. If you have any reason to think you are pregnant; stop taking this medicine at once and contact your doctor or health care professional. Tobacco smoking increases the risk of getting a blood clot or having a stroke, especially if you are more than 55 years old. You are strongly advised not to smoke. If you wear contact lenses and notice visual changes, or if the lenses begin to feel uncomfortable, consult your eye care specialist. If you are going to have elective surgery, you may need to stop taking this medicine beforehand. Consult your health care professional for advice prior to scheduling the surgery. What side effects may I notice from receiving this medicine? Side effects that you should report to your doctor or health care professional as soon as possible: -allergic reactions like skin rash, itching or hives, swelling of the face, lips, or tongue -breast tissue changes or discharge -changes in vision -chest pain -confusion, trouble speaking or understanding -dark urine -general ill feeling or flu-like symptoms -light-colored stools -nausea, vomiting -pain, swelling, warmth in the leg -right upper belly pain -severe headaches -shortness of breath -sudden numbness or weakness of the face, arm or leg -trouble walking, dizziness, loss of balance or coordination -unusual vaginal bleeding -yellowing of the eyes or skin Side effects that usually do not require medical attention (report to your doctor or health care professional if they continue or are bothersome): -hair loss -increased hunger or thirst -increased urination -symptoms of vaginal infection like itching, irritation or unusual discharge -unusually weak or tired This list may not describe all possible side effects. Call your doctor for medical advice about side effects. You may report side effects to FDA at 1-800-FDA-1088. Where should I keep my medicine? Keep out of the reach of children. Store at room  temperature between 15 and 30 degrees C (59 and 86 degrees F). Throw away any unused medicine after the expiration date. NOTE: This sheet is a summary. It may not cover all possible information. If you have questions about this medicine, talk to your doctor, pharmacist, or health care provider.    2016, [employer]vier/Gold Standard. (2014-04-09 09:22:51)

## 2016-02-10 NOTE — Progress Notes (Signed)
[name] [birth year] [id number]   History:    55 y.o.  for annual gyn exam with the only complaint is sometimes of vaginal dryness. Patient with no vasomotor symptoms. Patient never been on any hormone replacement therapy. Patient had a colonoscopy in 2015 benign polyps were removed and she is on a 3 year recall.Her last bone density study in 2015 demonstrated the lowest T score was at the AP spine -1.6. Patient with no previous history of any abnormal Pap smears. Patient with past history of beta thalassemia. Her PCP Dr. Mellody Drown has been doing her blood work. She declined flu vaccine today. Patient last month that tonsillectomy doing well.  Past medical history,surgical history, family history and social history were all reviewed and documented in the EPIC chart.  Gynecologic History No LMP recorded. Patient is postmenopausal. Contraception: post menopausal status Last Pap: 2012 and 2015. Results were: normal Last mammogram: 2016. Results were: normal  Obstetric History OB History  Gravida Para Term Preterm AB Living  2 2 2     2   SAB TAB Ectopic Multiple Live Births          2    # Outcome Date GA Lbr Len/2nd Weight Sex Delivery Anes PTL Lv  2 Term     M CS-Unspec  N LIV  1 Term     M Vag-Spont  N LIV       ROS: A ROS was performed and pertinent positives and negatives are included in the history.  GENERAL: No fevers or chills. HEENT: No change in vision, no earache, sore throat or sinus congestion. NECK: No pain or stiffness. CARDIOVASCULAR: No chest pain or pressure. No palpitations. PULMONARY: No shortness of breath, cough or wheeze. GASTROINTESTINAL: No abdominal pain, nausea, vomiting or diarrhea, melena or bright red blood per rectum. GENITOURINARY: No urinary frequency, urgency, hesitancy or dysuria. MUSCULOSKELETAL: No joint or muscle pain, no back pain, no recent trauma. DERMATOLOGIC: No rash, no itching, no lesions. ENDOCRINE: No polyuria, polydipsia, no heat or cold  intolerance. No recent change in weight. HEMATOLOGICAL: No anemia or easy bruising or bleeding. NEUROLOGIC: No headache, seizures, numbness, tingling or weakness. PSYCHIATRIC: No depression, no loss of interest in normal activity or change in sleep pattern.     Exam: chaperone present  BP 128/76   Ht 5' 2.25" (1.581 m)   Wt 137 lb (62.1 kg)   BMI 24.86 kg/m   Body mass index is 24.86 kg/m.  General appearance : Well developed well nourished female. No acute distress HEENT: Eyes: no retinal hemorrhage or exudates,  Neck supple, trachea midline, no carotid bruits, no thyroidmegaly Lungs: Clear to auscultation, no rhonchi or wheezes, or rib retractions  Heart: Regular rate and rhythm, no murmurs or gallops Breast:Examined in sitting and supine position were symmetrical in appearance, no palpable masses or tenderness,  no skin retraction, no nipple inversion, no nipple discharge, no skin discoloration, no axillary or supraclavicular lymphadenopathy Abdomen: no palpable masses or tenderness, no rebound or guarding Extremities: no edema or skin discoloration or tenderness  Pelvic:  Bartholin, Urethra, Skene Glands: Within normal limits             Vagina: No gross lesions or discharge  Cervix: No gross lesions or discharge  Uterus  anteverted, normal size, shape and consistency, non-tender and mobile  Adnexa  Without masses or tenderness  Anus and perineum  normal   Rectovaginal  normal sphincter tone without palpated masses or tenderness  Hemoccult PCP provides     Assessment/Plan:  55 y.o. female for annual exam with past history of osteopenia scheduled for bone density study this November. She was reminded to schedule her mammogram which is due this month. Discussed importance of calcium vitamin D and weightbearing exercises for osteoporosis prevention. Pap smear not indicated this year. Patient declined flu vaccine. PCP doing blood work.   Terrance Mass MD, 2:28 PM  02/10/2016

## 2016-02-11 ENCOUNTER — Other Ambulatory Visit: Payer: Self-pay | Admitting: Gynecology

## 2016-02-11 DIAGNOSIS — Z1231 Encounter for screening mammogram for malignant neoplasm of breast: Secondary | ICD-10-CM

## 2016-02-18 ENCOUNTER — Ambulatory Visit: Payer: BLUE CROSS/BLUE SHIELD

## 2016-02-26 ENCOUNTER — Ambulatory Visit
Admission: RE | Admit: 2016-02-26 | Discharge: 2016-02-26 | Disposition: A | Discharge status: Home / Self Care | Payer: BLUE CROSS/BLUE SHIELD | Source: Ambulatory Visit | Attending: Gynecology | Admitting: Gynecology

## 2016-02-26 DIAGNOSIS — Z1231 Encounter for screening mammogram for malignant neoplasm of breast: Secondary | ICD-10-CM

## 2016-03-03 ENCOUNTER — Other Ambulatory Visit: Payer: Self-pay | Admitting: Gynecology

## 2016-03-03 DIAGNOSIS — Z13820 Encounter for screening for osteoporosis: Secondary | ICD-10-CM

## 2016-03-03 DIAGNOSIS — M8580 Other specified disorders of bone density and structure, unspecified site: Secondary | ICD-10-CM

## 2016-03-17 ENCOUNTER — Ambulatory Visit (INDEPENDENT_AMBULATORY_CARE_PROVIDER_SITE_OTHER): Payer: BLUE CROSS/BLUE SHIELD

## 2016-03-17 DIAGNOSIS — M8580 Other specified disorders of bone density and structure, unspecified site: Secondary | ICD-10-CM | POA: Diagnosis not present

## 2016-03-17 DIAGNOSIS — Z13820 Encounter for screening for osteoporosis: Secondary | ICD-10-CM | POA: Diagnosis not present

## 2016-03-23 ENCOUNTER — Other Ambulatory Visit: Payer: Self-pay | Admitting: *Deleted

## 2016-03-23 DIAGNOSIS — M8580 Other specified disorders of bone density and structure, unspecified site: Secondary | ICD-10-CM

## 2016-03-24 ENCOUNTER — Other Ambulatory Visit: Payer: BLUE CROSS/BLUE SHIELD

## 2016-03-24 DIAGNOSIS — M8580 Other specified disorders of bone density and structure, unspecified site: Secondary | ICD-10-CM

## 2016-03-25 ENCOUNTER — Encounter: Payer: Self-pay | Admitting: Gynecology

## 2016-03-25 LAB — VITAMIN D 25 HYDROXY (VIT D DEFICIENCY, FRACTURES): VIT D 25 HYDROXY: 27 ng/mL — AB (ref 30–100)

## 2016-04-05 ENCOUNTER — Other Ambulatory Visit: Payer: Self-pay | Admitting: Gynecology

## 2016-04-05 DIAGNOSIS — E559 Vitamin D deficiency, unspecified: Secondary | ICD-10-CM

## 2016-04-05 MED ORDER — VITAMIN D (ERGOCALCIFEROL) 1.25 MG (50000 UNIT) PO CAPS: 50000.0000 [IU] | ORAL_CAPSULE | ORAL | 0 refills | Status: DC

## 2016-06-25 ENCOUNTER — Other Ambulatory Visit: Payer: Self-pay | Admitting: Gynecology

## 2016-06-30 ENCOUNTER — Other Ambulatory Visit: Payer: No Typology Code available for payment source

## 2016-06-30 DIAGNOSIS — E559 Vitamin D deficiency, unspecified: Secondary | ICD-10-CM

## 2016-07-01 LAB — VITAMIN D 25 HYDROXY (VIT D DEFICIENCY, FRACTURES): Vit D, 25-Hydroxy: 34 ng/mL (ref 30–100)

## 2016-09-21 ENCOUNTER — Encounter: Payer: Self-pay | Admitting: Gynecology

## 2016-10-19 ENCOUNTER — Other Ambulatory Visit: Payer: Self-pay | Admitting: Internal Medicine

## 2016-10-19 DIAGNOSIS — R3129 Other microscopic hematuria: Secondary | ICD-10-CM

## 2016-10-20 ENCOUNTER — Ambulatory Visit
Admission: RE | Admit: 2016-10-20 | Discharge: 2016-10-20 | Disposition: A | Discharge status: Home / Self Care | Payer: No Typology Code available for payment source | Source: Ambulatory Visit | Attending: Internal Medicine | Admitting: Internal Medicine

## 2016-10-20 DIAGNOSIS — R3129 Other microscopic hematuria: Secondary | ICD-10-CM

## 2017-01-24 ENCOUNTER — Other Ambulatory Visit: Payer: Self-pay | Admitting: Women's Health

## 2017-01-24 DIAGNOSIS — Z1231 Encounter for screening mammogram for malignant neoplasm of breast: Secondary | ICD-10-CM

## 2017-03-01 ENCOUNTER — Encounter: Payer: No Typology Code available for payment source | Admitting: Women's Health

## 2017-03-01 ENCOUNTER — Encounter: Payer: Self-pay | Admitting: Obstetrics & Gynecology

## 2017-03-01 ENCOUNTER — Other Ambulatory Visit: Payer: Self-pay | Admitting: Obstetrics & Gynecology

## 2017-03-01 ENCOUNTER — Ambulatory Visit (INDEPENDENT_AMBULATORY_CARE_PROVIDER_SITE_OTHER): Payer: No Typology Code available for payment source | Admitting: Obstetrics & Gynecology

## 2017-03-01 ENCOUNTER — Ambulatory Visit
Admission: RE | Admit: 2017-03-01 | Discharge: 2017-03-01 | Disposition: A | Discharge status: Home / Self Care | Payer: PRIVATE HEALTH INSURANCE | Source: Ambulatory Visit | Attending: Women's Health | Admitting: Women's Health

## 2017-03-01 VITALS — Ht 62.0 in | Wt 144.0 lb

## 2017-03-01 DIAGNOSIS — Z01419 Encounter for gynecological examination (general) (routine) without abnormal findings: Secondary | ICD-10-CM

## 2017-03-01 DIAGNOSIS — Z780 Asymptomatic menopausal state: Secondary | ICD-10-CM | POA: Diagnosis not present

## 2017-03-01 DIAGNOSIS — Z1231 Encounter for screening mammogram for malignant neoplasm of breast: Secondary | ICD-10-CM

## 2017-03-01 DIAGNOSIS — Z1151 Encounter for screening for human papillomavirus (HPV): Secondary | ICD-10-CM

## 2017-03-01 NOTE — Progress Notes (Signed)
[name] [birth year] [id number]   History:    56 y.o. G2P2L2  Married  RP:  Established patient presenting for annual gyn exam   HPI:  Menopause.  No HRT. No PMB.  No pelvic pain.  Sexually active, no pain with IC.  Breasts wnl.  Mictions/BMs wnl.  BMI 26.34.  Exercises at home.  Past medical history,surgical history, family history and social history were all reviewed and documented in the EPIC chart.  Gynecologic History No LMP recorded. Patient is postmenopausal. Contraception: post menopausal status Last Pap: 01/2014. Results were: Negative Last mammogram: 03/01/2017. Results were: Done today, pending results Dexa 2017 Mild Ostopenia Colono 2018  Obstetric History OB History  Gravida Para Term Preterm AB Living  2 2 2     2   SAB TAB Ectopic Multiple Live Births          2    # Outcome Date GA Lbr Len/2nd Weight Sex Delivery Anes PTL Lv  2 Term     M CS-Unspec  N LIV  1 Term     M Vag-Spont  N LIV       ROS: A ROS was performed and pertinent positives and negatives are included in the history.  GENERAL: No fevers or chills. HEENT: No change in vision, no earache, sore throat or sinus congestion. NECK: No pain or stiffness. CARDIOVASCULAR: No chest pain or pressure. No palpitations. PULMONARY: No shortness of breath, cough or wheeze. GASTROINTESTINAL: No abdominal pain, nausea, vomiting or diarrhea, melena or bright red blood per rectum. GENITOURINARY: No urinary frequency, urgency, hesitancy or dysuria. MUSCULOSKELETAL: No joint or muscle pain, no back pain, no recent trauma. DERMATOLOGIC: No rash, no itching, no lesions. ENDOCRINE: No polyuria, polydipsia, no heat or cold intolerance. No recent change in weight. HEMATOLOGICAL: No anemia or easy bruising or bleeding. NEUROLOGIC: No headache, seizures, numbness, tingling or weakness. PSYCHIATRIC: No depression, no loss of interest in normal activity or change in sleep pattern.     Exam:   Ht 5\' 2"  (1.575 m)   Wt 144 lb  (65.3 kg)   BMI 26.34 kg/m   Body mass index is 26.34 kg/m.  General appearance : Well developed well nourished female. No acute distress HEENT: Eyes: no retinal hemorrhage or exudates,  Neck supple, trachea midline, no carotid bruits, no thyroidmegaly Lungs: Clear to auscultation, no rhonchi or wheezes, or rib retractions  Heart: Regular rate and rhythm, no murmurs or gallops Breast:Examined in sitting and supine position were symmetrical in appearance, no palpable masses or tenderness,  no skin retraction, no nipple inversion, no nipple discharge, no skin discoloration, no axillary or supraclavicular lymphadenopathy Abdomen: no palpable masses or tenderness, no rebound or guarding Extremities: no edema or skin discoloration or tenderness  Pelvic: Vulva normal  Bartholin, Urethra, Skene Glands: Within normal limits             Vagina: No gross lesions or discharge  Cervix: No gross lesions or discharge.  Pap/HR HPV done.  Uterus  AV, normal size, shape and consistency, non-tender and mobile  Adnexa  Without masses or tenderness  Anus and perineum  normal    Assessment/Plan:  56 y.o. female for annual exam   1. Encounter for routine gynecological examination with Papanicolaou smear of cervix Normal gyn exam.  Pap/HPV done.  Breasts wnl.  Screening mammo done today, results pending.  Colono 2018.  2. Menopause present No HRT.  No PMB.  Osteopenia.  Vit D supplements, Ca++ in food,  Weight bearing physical activity.  Princess Bruins MD, 3:01 PM 03/01/2017

## 2017-03-01 NOTE — Addendum Note (Signed)
Addended by: Thurnell Garbe A on: 03/01/2017 03:35 PM   Modules accepted: Orders

## 2017-03-01 NOTE — Patient Instructions (Signed)
1. Encounter for routine gynecological examination with Papanicolaou smear of cervix Normal gyn exam.  Pap/HPV done.  Breasts wnl.  Screening mammo done today, results pending.  Colono 2018.  2. Menopause present No HRT.  No PMB.  Osteopenia.  Vit D supplements, Ca++ in food, Weight bearing physical activity.  [name], it was a pleasure meeting you today!  I will inform you of your results as soon as available.   Health Maintenance for Postmenopausal Women Menopause is a normal process in which your reproductive ability comes to an end. This process happens gradually over a span of months to years, usually between the ages of 79 and 11. Menopause is complete when you have missed 12 consecutive menstrual periods. It is important to talk with your health care provider about some of the most common conditions that affect postmenopausal women, such as heart disease, cancer, and bone loss (osteoporosis). Adopting a healthy lifestyle and getting preventive care can help to promote your health and wellness. Those actions can also lower your chances of developing some of these common conditions. What should I know about menopause? During menopause, you may experience a number of symptoms, such as:  Moderate-to-severe hot flashes.  Night sweats.  Decrease in sex drive.  Mood swings.  Headaches.  Tiredness.  Irritability.  Memory problems.  Insomnia.  Choosing to treat or not to treat menopausal changes is an individual decision that you make with your health care provider. What should I know about hormone replacement therapy and supplements? Hormone therapy products are effective for treating symptoms that are associated with menopause, such as hot flashes and night sweats. Hormone replacement carries certain risks, especially as you become older. If you are thinking about using estrogen or estrogen with progestin treatments, discuss the benefits and risks with your health care provider. What  should I know about heart disease and stroke? Heart disease, heart attack, and stroke become more likely as you age. This may be due, in part, to the hormonal changes that your body experiences during menopause. These can affect how your body processes dietary fats, triglycerides, and cholesterol. Heart attack and stroke are both medical emergencies. There are many things that you can do to help prevent heart disease and stroke:  Have your blood pressure checked at least every 1-2 years. High blood pressure causes heart disease and increases the risk of stroke.  If you are 55-52 years old, ask your health care provider if you should take aspirin to prevent a heart attack or a stroke.  Do not use any tobacco products, including cigarettes, chewing tobacco, or electronic cigarettes. If you need help quitting, ask your health care provider.  It is important to eat a healthy diet and maintain a healthy weight. ? Be sure to include plenty of vegetables, fruits, low-fat dairy products, and lean protein. ? Avoid eating foods that are high in solid fats, added sugars, or salt (sodium).  Get regular exercise. This is one of the most important things that you can do for your health. ? Try to exercise for at least 150 minutes each week. The type of exercise that you do should increase your heart rate and make you sweat. This is known as moderate-intensity exercise. ? Try to do strengthening exercises at least twice each week. Do these in addition to the moderate-intensity exercise.  Know your numbers.Ask your health care provider to check your cholesterol and your blood glucose. Continue to have your blood tested as directed by your health care provider.  What should I know about cancer screening? There are several types of cancer. Take the following steps to reduce your risk and to catch any cancer development as early as possible. Breast Cancer  Practice breast self-awareness. ? This means  understanding how your breasts normally appear and feel. ? It also means doing regular breast self-exams. Let your health care provider know about any changes, no matter how small.  If you are 40 or older, have a clinician do a breast exam (clinical breast exam or CBE) every year. Depending on your age, family history, and medical history, it may be recommended that you also have a yearly breast X-ray (mammogram).  If you have a family history of breast cancer, talk with your health care provider about genetic screening.  If you are at high risk for breast cancer, talk with your health care provider about having an MRI and a mammogram every year.  Breast cancer (BRCA) gene test is recommended for women who have family members with BRCA-related cancers. Results of the assessment will determine the need for genetic counseling and BRCA1 and for BRCA2 testing. BRCA-related cancers include these types: ? Breast. This occurs in males or females. ? Ovarian. ? Tubal. This may also be called fallopian tube cancer. ? Cancer of the abdominal or pelvic lining (peritoneal cancer). ? Prostate. ? Pancreatic.  Cervical, Uterine, and Ovarian Cancer Your health care provider may recommend that you be screened regularly for cancer of the pelvic organs. These include your ovaries, uterus, and vagina. This screening involves a pelvic exam, which includes checking for microscopic changes to the surface of your cervix (Pap test).  For women ages 21-65, health care providers may recommend a pelvic exam and a Pap test every three years. For women ages 30-65, they may recommend the Pap test and pelvic exam, combined with testing for human papilloma virus (HPV), every five years. Some types of HPV increase your risk of cervical cancer. Testing for HPV may also be done on women of any age who have unclear Pap test results.  Other health care providers may not recommend any screening for nonpregnant women who are  considered low risk for pelvic cancer and have no symptoms. Ask your health care provider if a screening pelvic exam is right for you.  If you have had past treatment for cervical cancer or a condition that could lead to cancer, you need Pap tests and screening for cancer for at least 20 years after your treatment. If Pap tests have been discontinued for you, your risk factors (such as having a new sexual partner) need to be reassessed to determine if you should start having screenings again. Some women have medical problems that increase the chance of getting cervical cancer. In these cases, your health care provider may recommend that you have screening and Pap tests more often.  If you have a family history of uterine cancer or ovarian cancer, talk with your health care provider about genetic screening.  If you have vaginal bleeding after reaching menopause, tell your health care provider.  There are currently no reliable tests available to screen for ovarian cancer.  Lung Cancer Lung cancer screening is recommended for adults 16-31 years old who are at high risk for lung cancer because of a history of smoking. A yearly low-dose CT scan of the lungs is recommended if you:  Currently smoke.  Have a history of at least 30 pack-years of smoking and you currently smoke or have quit within the past  15 years. A pack-year is smoking an average of one pack of cigarettes per day for one year.  Yearly screening should:  Continue until it has been 15 years since you quit.  Stop if you develop a health problem that would prevent you from having lung cancer treatment.  Colorectal Cancer  This type of cancer can be detected and can often be prevented.  Routine colorectal cancer screening usually begins at age 75 and continues through age 72.  If you have risk factors for colon cancer, your health care provider may recommend that you be screened at an earlier age.  If you have a family history of  colorectal cancer, talk with your health care provider about genetic screening.  Your health care provider may also recommend using home test kits to check for hidden blood in your stool.  A small camera at the end of a tube can be used to examine your colon directly (sigmoidoscopy or colonoscopy). This is done to check for the earliest forms of colorectal cancer.  Direct examination of the colon should be repeated every 5-10 years until age 79. However, if early forms of precancerous polyps or small growths are found or if you have a family history or genetic risk for colorectal cancer, you may need to be screened more often.  Skin Cancer  Check your skin from head to toe regularly.  Monitor any moles. Be sure to tell your health care provider: ? About any new moles or changes in moles, especially if there is a change in a mole's shape or color. ? If you have a mole that is larger than the size of a pencil eraser.  If any of your family members has a history of skin cancer, especially at a young age, talk with your health care provider about genetic screening.  Always use sunscreen. Apply sunscreen liberally and repeatedly throughout the day.  Whenever you are outside, protect yourself by wearing long sleeves, pants, a wide-brimmed hat, and sunglasses.  What should I know about osteoporosis? Osteoporosis is a condition in which bone destruction happens more quickly than new bone creation. After menopause, you may be at an increased risk for osteoporosis. To help prevent osteoporosis or the bone fractures that can happen because of osteoporosis, the following is recommended:  If you are 23-11 years old, get at least 1,000 mg of calcium and at least 600 mg of vitamin D per day.  If you are older than age 66 but younger than age 57, get at least 1,200 mg of calcium and at least 600 mg of vitamin D per day.  If you are older than age 36, get at least 1,200 mg of calcium and at least 800 mg  of vitamin D per day.  Smoking and excessive alcohol intake increase the risk of osteoporosis. Eat foods that are rich in calcium and vitamin D, and do weight-bearing exercises several times each week as directed by your health care provider. What should I know about how menopause affects my mental health? Depression may occur at any age, but it is more common as you become older. Common symptoms of depression include:  Low or sad mood.  Changes in sleep patterns.  Changes in appetite or eating patterns.  Feeling an overall lack of motivation or enjoyment of activities that you previously enjoyed.  Frequent crying spells.  Talk with your health care provider if you think that you are experiencing depression. What should I know about immunizations? It is important  that you get and maintain your immunizations. These include:  Tetanus, diphtheria, and pertussis (Tdap) booster vaccine.  Influenza every year before the flu season begins.  Pneumonia vaccine.  Shingles vaccine.  Your health care provider may also recommend other immunizations. This information is not intended to replace advice given to you by your health care provider. Make sure you discuss any questions you have with your health care provider. Document Released: 06/17/2005 Document Revised: 11/13/2015 Document Reviewed: 01/27/2015 Elsevier Interactive Patient Education  2018 [employer]  

## 2017-03-06 LAB — PAP, TP IMAGING W/ HPV RNA, RFLX HPV TYPE 16,18/45: HPV DNA High Risk: NOT DETECTED

## 2018-01-26 ENCOUNTER — Other Ambulatory Visit: Payer: Self-pay | Admitting: Obstetrics & Gynecology

## 2018-01-26 DIAGNOSIS — Z1231 Encounter for screening mammogram for malignant neoplasm of breast: Secondary | ICD-10-CM

## 2018-03-02 ENCOUNTER — Encounter: Payer: No Typology Code available for payment source | Admitting: Obstetrics & Gynecology

## 2018-03-02 ENCOUNTER — Ambulatory Visit
Admission: RE | Admit: 2018-03-02 | Discharge: 2018-03-02 | Disposition: A | Discharge status: Home / Self Care | Payer: PRIVATE HEALTH INSURANCE | Source: Ambulatory Visit | Attending: Obstetrics & Gynecology | Admitting: Obstetrics & Gynecology

## 2018-03-02 DIAGNOSIS — Z1231 Encounter for screening mammogram for malignant neoplasm of breast: Secondary | ICD-10-CM

## 2018-03-21 ENCOUNTER — Ambulatory Visit (INDEPENDENT_AMBULATORY_CARE_PROVIDER_SITE_OTHER): Payer: No Typology Code available for payment source | Admitting: Obstetrics & Gynecology

## 2018-03-21 ENCOUNTER — Encounter: Payer: Self-pay | Admitting: Obstetrics & Gynecology

## 2018-03-21 VITALS — BP 126/78 | Ht 62.0 in | Wt 142.0 lb

## 2018-03-21 DIAGNOSIS — Z780 Asymptomatic menopausal state: Secondary | ICD-10-CM | POA: Diagnosis not present

## 2018-03-21 DIAGNOSIS — Z01419 Encounter for gynecological examination (general) (routine) without abnormal findings: Secondary | ICD-10-CM

## 2018-03-21 DIAGNOSIS — M8589 Other specified disorders of bone density and structure, multiple sites: Secondary | ICD-10-CM | POA: Diagnosis not present

## 2018-03-21 NOTE — Progress Notes (Signed)
[name] [birth year] [id number]   History:    56 y.o. G2P2L2 Married  RP:  Established patient presenting for annual gyn exam   HPI: Postmenopausal, well on no HRT.  No pelvic pain.  No pain with intercourse.  Urine and bowel movements normal.  Breasts normal.  Body mass index 25.97.  Healthy nutrition and physically active.  Health labs with family physician.  Past medical history,surgical history, family history and social history were all reviewed and documented in the EPIC chart.  Gynecologic History No LMP recorded. Patient is postmenopausal. Contraception: post menopausal status Last Pap: 02/2017. Results were: Negative/HPV HR neg Last mammogram: 02/2018. Results were: Negative Bone Density: 03/2016 Osteopenia Colonoscopy: 2018  Obstetric History OB History  Gravida Para Term Preterm AB Living  2 2 2     2   SAB TAB Ectopic Multiple Live Births          2    # Outcome Date GA Lbr Len/2nd Weight Sex Delivery Anes PTL Lv  2 Term     M CS-Unspec  N LIV  1 Term     M Vag-Spont  N LIV     ROS: A ROS was performed and pertinent positives and negatives are included in the history.  GENERAL: No fevers or chills. HEENT: No change in vision, no earache, sore throat or sinus congestion. NECK: No pain or stiffness. CARDIOVASCULAR: No chest pain or pressure. No palpitations. PULMONARY: No shortness of breath, cough or wheeze. GASTROINTESTINAL: No abdominal pain, nausea, vomiting or diarrhea, melena or bright red blood per rectum. GENITOURINARY: No urinary frequency, urgency, hesitancy or dysuria. MUSCULOSKELETAL: No joint or muscle pain, no back pain, no recent trauma. DERMATOLOGIC: No rash, no itching, no lesions. ENDOCRINE: No polyuria, polydipsia, no heat or cold intolerance. No recent change in weight. HEMATOLOGICAL: No anemia or easy bruising or bleeding. NEUROLOGIC: No headache, seizures, numbness, tingling or weakness. PSYCHIATRIC: No depression, no loss of interest in normal  activity or change in sleep pattern.     Exam:   BP 126/78   Ht 5\' 2"  (1.575 m)   Wt 142 lb (64.4 kg)   BMI 25.97 kg/m   Body mass index is 25.97 kg/m.  General appearance : Well developed well nourished female. No acute distress HEENT: Eyes: no retinal hemorrhage or exudates,  Neck supple, trachea midline, no carotid bruits, no thyroidmegaly Lungs: Clear to auscultation, no rhonchi or wheezes, or rib retractions  Heart: Regular rate and rhythm, no murmurs or gallops Breast:Examined in sitting and supine position were symmetrical in appearance, no palpable masses or tenderness,  no skin retraction, no nipple inversion, no nipple discharge, no skin discoloration, no axillary or supraclavicular lymphadenopathy Abdomen: no palpable masses or tenderness, no rebound or guarding Extremities: no edema or skin discoloration or tenderness  Pelvic: Vulva: Normal             Vagina: No gross lesions or discharge  Cervix: No gross lesions or discharge  Uterus  AV, normal size, shape and consistency, non-tender and mobile  Adnexa  Without masses or tenderness  Anus: Normal   Assessment/Plan:  57 y.o. female for annual exam   1. Well female exam with routine gynecological exam Normal gynecologic exam.  Pap test was negative with negative high-risk HPV October 2018.  Will repeat at 2 to 3 years.  Breast exam normal.  Screening mammogram October 2019 was negative.  Colonoscopy in 2018.  Health labs with family physician.  Body mass index  25.97.  Continue with healthy nutrition and regular physical activity.  2. Postmenopause Well on no hormone replacement therapy.  No postmenopausal bleeding.  3. Osteopenia of multiple sites Osteopenia on last bone density November 2017.  Repeat bone density here now.  Recommend vitamin D supplements, calcium intake of 1.5 g/day and regular weightbearing physical activity. - DG Bone Density; Future   Princess Bruins MD, 4:35 PM 03/21/2018

## 2018-03-24 NOTE — Patient Instructions (Signed)
1. Well female exam with routine gynecological exam Normal gynecologic exam.  Pap test was negative with negative high-risk HPV October 2018.  Will repeat at 2 to 3 years.  Breast exam normal.  Screening mammogram October 2019 was negative.  Colonoscopy in 2018.  Health labs with family physician.  Body mass index 25.97.  Continue with healthy nutrition and regular physical activity.  2. Postmenopause Well on no hormone replacement therapy.  No postmenopausal bleeding.  3. Osteopenia of multiple sites Osteopenia on last bone density November 2017.  Repeat bone density here now.  Recommend vitamin D supplements, calcium intake of 1.5 g/day and regular weightbearing physical activity. - DG Bone Density; Future  Delmar Landau, it was a pleasure seeing you today!  I will inform you of your bone density results when available.

## 2018-04-09 ENCOUNTER — Ambulatory Visit (INDEPENDENT_AMBULATORY_CARE_PROVIDER_SITE_OTHER): Payer: No Typology Code available for payment source

## 2018-04-09 ENCOUNTER — Other Ambulatory Visit: Payer: Self-pay | Admitting: Obstetrics & Gynecology

## 2018-04-09 DIAGNOSIS — Z13820 Encounter for screening for osteoporosis: Secondary | ICD-10-CM

## 2018-04-09 DIAGNOSIS — M8589 Other specified disorders of bone density and structure, multiple sites: Secondary | ICD-10-CM | POA: Diagnosis not present

## 2019-03-25 ENCOUNTER — Encounter: Payer: No Typology Code available for payment source | Admitting: Obstetrics & Gynecology

## 2019-04-16 ENCOUNTER — Other Ambulatory Visit: Payer: Self-pay

## 2019-04-16 DIAGNOSIS — Z20822 Contact with and (suspected) exposure to covid-19: Secondary | ICD-10-CM

## 2019-04-18 LAB — NOVEL CORONAVIRUS, NAA: SARS-CoV-2, NAA: DETECTED — AB

## 2019-08-17 ENCOUNTER — Ambulatory Visit: Payer: PRIVATE HEALTH INSURANCE | Attending: Internal Medicine

## 2019-08-17 DIAGNOSIS — Z23 Encounter for immunization: Secondary | ICD-10-CM

## 2019-08-17 NOTE — Progress Notes (Signed)
   Covid-19 Vaccination [facility]  Name:  [name]    MRN: [id number] DOB: 10/23/1960  08/17/2019  [name] was observed post Covid-19 immunization for 15 minutes without incident. She was provided with Vaccine Information Sheet and instruction to access the V-Safe system.   [name] was instructed to call 911 with any severe reactions post vaccine: Marland Kitchen Difficulty breathing  . Swelling of face and throat  . A fast heartbeat  . A bad rash all over body  . Dizziness and weakness   Immunizations Administered    Name Date Dose VIS Date Route   Pfizer COVID-19 Vaccine 08/17/2019  4:47 PM 0.3 mL 04/19/2019 Intramuscular   Manufacturer: [place]   Lot: [id number]   [place]: [id number]

## 2019-09-09 ENCOUNTER — Ambulatory Visit: Payer: PRIVATE HEALTH INSURANCE | Attending: Internal Medicine

## 2019-09-09 DIAGNOSIS — Z23 Encounter for immunization: Secondary | ICD-10-CM

## 2019-09-09 NOTE — Progress Notes (Signed)
   [facility] Vaccination [facility]  Name:  [name]    MRN: [id number] DOB: Dec 22, 1960  09/09/2019  [name] was observed post Covid-19 immunization for 15 minutes without incident. She was provided with Vaccine Information Sheet and instruction to access the V-Safe system.   [name] was instructed to call 911 with any severe reactions post vaccine: Marland Kitchen Difficulty breathing  . Swelling of face and throat  . A fast heartbeat  . A bad rash all over body  . Dizziness and weakness   Immunizations Administered    Name Date Dose VIS Date Route   Pfizer COVID-19 Vaccine 09/09/2019  1:19 PM 0.3 mL 07/03/2018 Intramuscular   Manufacturer: [place]   Lot: [id number]   [place]: [id number]
